# Patient Record
Sex: Female | Born: 1985 | Race: White | Hispanic: No | Marital: Single
Health system: Midwestern US, Community
[De-identification: ages and names within clinical notes are randomized; demographics above are authoritative.]

## PROBLEM LIST (undated history)

## (undated) DIAGNOSIS — M25549 Pain in joints of unspecified hand: Secondary | ICD-10-CM

## (undated) HISTORY — PX: ABDOMINAL HYSTERECTOMY: SHX81

## (undated) HISTORY — PX: APPENDECTOMY: SHX54

---

## 2010-11-12 LAB — CBC WITH DIFFERENTIAL
Absolute Baso #: 0.1 10*3/uL (ref 0.0–0.2)
Absolute Eos #: 0.1 10*3/uL (ref 0.0–0.4)
Absolute Lymph #: 3.7 10*3/uL (ref 1.0–4.8)
Absolute Mono #: 1 10*3/uL (ref 0.1–1.3)
Absolute Neut #: 10.8 10*3/uL — ABNORMAL HIGH (ref 1.3–9.1)
Basophils: 0 % (ref 0–2)
Eosinophils %: 1 % (ref 0–4)
Hematocrit: 36.6 % (ref 36–46)
Hemoglobin: 13 g/dL (ref 12.0–16.0)
Lymphocytes: 24 % (ref 22–44)
MCH: 31.4 pg (ref 26–34)
MCHC: 35.6 g/dL (ref 31–37)
MCV: 88.3 fL (ref 80–100)
MPV: 8.3 fL (ref 6.0–12.0)
Monocytes: 7 % (ref 1–7)
Platelet Count: 293 10*3/uL (ref 150–450)
RBC: 4.15 m/uL (ref 4.0–5.2)
RDW: 12 % (ref 11.5–14.9)
Seg Neutrophils: 68 % — ABNORMAL HIGH (ref 36–66)
WBC: 15.7 10*3/uL — ABNORMAL HIGH (ref 3.5–11.0)

## 2010-11-12 LAB — MICROSCOPIC URINALYSIS
RBC, UA: 0 /HPF
WBC, UA: 2 /HPF

## 2010-11-12 LAB — UA W/REFLEX CULTURE
Bilirubin, Urine: NEGATIVE
Glucose, UA: NEGATIVE
Ketones, Urine: NEGATIVE
Leukocyte Esterase, Urine: NEGATIVE
Nitrite, Urine: NEGATIVE
Protein, UA: NEGATIVE
Specific Gravity, UA: 1.027 (ref 1.000–1.030)
Urine Hgb: NEGATIVE
Urobilinogen, Urine: NORMAL
pH, UA: 6 (ref 5.0–8.0)

## 2010-11-12 LAB — VAGINITIS DNA PROBE
Direct Exam: NEGATIVE
Direct Exam: NEGATIVE
Direct Exam: NEGATIVE

## 2010-11-12 LAB — ELECTROLYTE PANEL
Anion Gap: 8 mmol/L (ref 8–16)
CO2: 26 mmol/L (ref 20–31)
Chloride: 108 mmol/L (ref 98–110)
Potassium: 3.3 mmol/L — ABNORMAL LOW (ref 3.5–5.1)
Sodium: 139 mmol/L (ref 136–145)

## 2010-11-12 LAB — GLUCOSE, RANDOM: Glucose: 105 mg/dL (ref 74–106)

## 2010-11-12 LAB — BUN & CREATININE
BUN: 12 mg/dL (ref 6–20)
Creatinine: 0.65 mg/dL (ref 0.4–1.0)
GFR African American: >60 mL/min (ref >60)
GFR Non-African American: >60 mL/min (ref >60)

## 2010-11-12 LAB — PREGNANCY, URINE: HCG(Urine) Pregnancy Test: NEGATIVE

## 2010-11-13 LAB — C. TRACHOMATIS / N. GONORRHOEAE, DNA

## 2010-11-13 LAB — URINE CULTURE CLEAN CATCH: Culture: NO GROWTH

## 2012-04-14 LAB — PREGNANCY, URINE: HCG(Urine) Pregnancy Test: NEGATIVE

## 2012-07-23 ENCOUNTER — Inpatient Hospital Stay
Admit: 2012-07-23 | Disposition: A | Discharge status: Nursing Home (Includes ICF) | Attending: Psychiatry | Admitting: Psychiatry

## 2013-03-31 ENCOUNTER — Inpatient Hospital Stay
Admit: 2013-03-31 | Discharge: 2013-03-31 | Disposition: A | Discharge status: Home / Self Care | Attending: Emergency Medicine

## 2013-03-31 MED ORDER — MORPHINE SULFATE (PF) 10 MG/ML IV SOLN
10 MG/ML | Freq: Once | INTRAVENOUS | Status: AC
Start: 2013-03-31 — End: 2013-03-31
  Administered 2013-03-31: 10:00:00 via INTRAMUSCULAR

## 2013-03-31 MED ORDER — OXYCODONE-ACETAMINOPHEN 5-325 MG PO TABS
5-325 MG | ORAL_TABLET | ORAL | Status: AC | PRN
Start: 2013-03-31 — End: 2013-04-20

## 2013-03-31 MED ORDER — MORPHINE SULFATE (PF) 4 MG/ML IV SOLN
4 MG/ML | Freq: Once | INTRAVENOUS | Status: AC
Start: 2013-03-31 — End: 2013-03-31
  Administered 2013-03-31: 08:00:00 via INTRAMUSCULAR

## 2013-03-31 MED FILL — MORPHINE SULFATE (PF) 10 MG/ML IV SOLN: 10 mg/mL | INTRAVENOUS | Qty: 1

## 2013-03-31 MED FILL — MORPHINE SULFATE (PF) 4 MG/ML IV SOLN: 4 mg/mL | INTRAVENOUS | Qty: 1

## 2013-03-31 NOTE — ED Notes (Signed)
Pt. States that she slipped and fell at 2300 on her kitchen floor and hurt her left knee, hip, buttocks, and back. Pt. States she feel on her butt/back, but her knee twisted funny in the fall. Pt. States pain 9/10, and that she cannot get comfortable. Pt. States 4 previous knee surgeries on this knee.    Creig Hines, RN  03/31/13 0230

## 2013-03-31 NOTE — Discharge Instructions (Signed)
ICE PACK OFF AND ON FOR SWELLING.  PERCOCET AS NEEDED FOR SEVERE PAIN OTHERWISE ES TYLENOL.    Back Exercises  Back exercises help treat and prevent back injuries. The goal is to increase your strength in your belly (abdominal) and back muscles. These exercises can also help with flexibility. Start these exercises when told by your doctor.  HOME CARE  Back exercises include:  Pelvic Tilt.   Lie on your back with your knees bent. Tilt your pelvis until the lower part of your back is against the floor. Hold this position 5 to 10 sec. Repeat this exercise 5 to 10 times.  Knee to Chest.   Pull 1 knee up against your chest and hold for 20 to 30 seconds. Repeat this with the other knee. This may be done with the other leg straight or bent, whichever feels better. Then, pull both knees up against your chest.  Sit-Ups or Curl-Ups.   Bend your knees 90 degrees. Start with tilting your pelvis, and do a partial, slow sit-up. Only lift your upper half 30 to 45 degrees off the floor. Take at least 2 to 3 seonds for each sit-up. Do not do sit-ups with your knees out straight. If partial sit-ups are difficult, simply do the above but with only tightening your belly (abdominal) muscles and holding it as told.  Hip-Lift.   Lie on your back with your knees flexed 90 degrees. Push down with your feet and shoulders as you raise your hips 2 inches off the floor. Hold for 10 seconds, repeat 5 to 10 times.  Back Arches.   Lie on your stomach. Prop yourself up on bent elbows. Slowly press on your hands, causing an arch in your low back. Repeat 3 to 5 times.  Shoulder-Lifts.   Lie face down with arms beside your body. Keep hips and belly pressed to floor as you slowly lift your head and shoulders off the floor.  Do not overdo your exercises. Be careful in the beginning. Exercises may cause you some mild back discomfort. If the pain lasts for more than 15 minutes, stop the exercises until you see your doctor. Improvement with exercise  for back problems is slow.   Document Released: 11/23/2010 Document Revised: 03/  09/2012 Document Reviewed: 08/22/2011  [facility] Patient Information 2014 [place], [place].      Knee Sprain  A knee sprain happens when the bands of tissue that hold your knee bone together (ligaments) stretch too much or tear. This injury can take several weeks to heal.  HOME CARE   Rest the injured area.   Slowly start using the joint as told by your doctor.   Use crutches as told. Do not put weight on your injured knee.   Reapply your elastic wrap every 3 to 4 hours.   Lie down for 24 hours. Raise (elevate) your injured knee to lessen puffiness (swelling).   Put ice on the injured area.   Put ice in a plastic bag.   Place a towel between your skin and the bag.   Leave the ice on for 15-20 minutes, 3-4 times a day.   Wear a splint, cast, or an elastic wrap as told by your doctor.   Only take medicine as told by your doctor.  GET HELP RIGHT AWAY IF:    Your bruising, puffiness, or pain gets worse.   You have cold, numb, or blue toes.   You have trouble walking.   Your pain is worse when  you move your toes.   Your pain does not get better with medicine.  MAKE SURE YOU:    Understand these instructions.   Will watch your condition.   Will get help right away if you are not doing well or get worse.  Document Released: 10/09/2009 Document Revised: 01/13/2012 Document Reviewed: 10/09/2009  [facility] Patient Information 2014 [place], [place].      [facility] Emergency Medicine Clinic List    [facility]  [address]  [phone] Pediatric Primary Care  Adult Primary Care  OB/GYN/Prenatal/Specialty Clinics Monday - Friday  8a - 430p   [facility]  [phone] Assistance with applying for chronic long term meds (high BP, Diabetes, etc), offered thru programs made available by various pharmaceutical companies Monday - Friday  Call to make an appointment   [facility]  [address]  [phone] Pediatrics and [facility] Practice Monday - Friday  9a - 7p   [facility]  [address]  [phone] Adult Medicine, Pediatrics, OB/GYN Monday - Friday  8:30a - 5p   [facility]  [address]  [phone]  Wednesday AM each week   [facility]  [facility]  [address] Adult Internal Medicine   [facility])  [phone]  OB/GYN Clinic  [phone]    Pediatrics Clinic  [phone]    Podiatry  [phone] Monday, Tuesday, Thursday, Friday  8a - 4p  Wednesday 1p - 4p  Monday, Tuesday, Thursday, Friday  10a - 4p  Wednesday 1p - 4p  Monday, Tuesday, Thursday, Friday  8:30a - 4:15p  Wednesday 12:30p - 4:15p  Monday, Wednesday, Friday  1p - 5p   Health Department  [facility]  [address]  [phone] Pediatric Primary Care  Adult Primary Care  OB/Prenatal Monday - Wednesday, Friday  Monday - Friday 8a - 12p  Thursday 8a - 4:45p   [facility]  [address] [facility]  [phone]  [address]  [phone] Pregnancy  Pre & Post Adoption Counseling  Pregnancy Support  Prenatal / nutrition Care  Reward Incentive Program Mon & Thurs (10a - 2p)  Tues (10a - 430p)  Caleen [place] (9a - 1p)   [facility]  [address] OB/GYN   [phone]  Adult Internal Medicine   [phone]  Pediatrics  [phone]  Neuro / Headache  [phone] Monday - Friday  8:30a - 5p   [facility]  [address]  [phone] Family Practice Monday - Friday  9a - 5p   [facility]  [address]  [phone] Family Practice Monday, Tuesday, Thursday, Friday  9a - 5p  (closed 12p - 1p)  Wednesday 1p - 5p   [facility]   Specialty Clinics  [phone] Burn/Plastric, ENT, GI, Orthopedics, Surgical / Trauma, Urology, Vascular Call for an appointment   [facility]  [address]  [phone] Adult Medicine, Eye Clinic, Dental  Patient must be certified  homeless  Under age 66 not accepted Days and hours vary (Doors open at 8:30a - day of week varies)  Call for an appointment     [facility]  [address] Family Practice  [phone]  OB   [phone] Monday, Tuesday, Wednesday, Friday 9a - 5p  Thursday 9a - 6p  Wednesday (OB only)   [facility]  [address]  [phone]    [address]  [phone]   OB/Prenatal      OB/Prenatal Monday - Thursday pa - 6p  Friday 10a - 3p  Saturday 1st & 3rd 10a - 2p  Wednesday 6p - 8p (HIV Testing)  Monday - Friday  10a - 3p   [facility]  [address]  [phone] Free nurse visits  [place] programs  Counseling  Pregnancy Class Call   [facility]  [address]  [phone] Various Clinics 8a - 5:30p   [facility]  [address]  [place], [place] [address]  [phone] OB/Prenatal    Family Practice Tuesday 8a - 4:45p    Monday - Wednesday, Friday  8a - 4:45p   [facility] Family Medicine  [facility]  [address]  [phone] Family Practice Monday - Friday  8a - 4:30p   [facility]  [address] [place], [place] [address]  [phone] [place], [place] [address]  [phone]    Monday - Friday  8a - 4:30p    Monday - Friday 8a - 4:30p  Thursday 8a - 8p     Outpatient Clinics     Asthma Management Clinic  [facility]  [address]  [phone]  Monday - Friday  9a - 5p   [facility]  [phone]  Call for an appointment   [facility]  Heart Failure Clinic  [address]  [phone]  Monday - Friday  8:30a - 4p   [facility]  [address]  [phone] Must have source of income and must bring (2) recent check stubs to appointment By appointment only   [facility]  [address]  [phone] Patient must be homeless, call for   eligibility guidelines.  Under age 74 NOT accepted Days and  hours vary (Doors open at 8:30a - day of week varies)  Call for an appointment   Miscellaneous Information     [employer] First [employer] for Help  [phone])  Call for an appointment   H.E.L.P   [facility] Eligibility [facility])  [phone]   toll free [phone] For financial assistance

## 2013-04-08 NOTE — ED Provider Notes (Signed)
[facility] ED  eMERGENCY dEPARTMENT eNCOUnter      Pt Name: Linda Gilbert  MRN: [id number]  Birthdate 09-Jan-1986  Date of evaluation: 03/31/2013  Provider: Christean Leaf, MD    CHIEF COMPLAINT       Chief Complaint   Patient presents with   ??? Fall   ??? Hip Pain     left side pain   ??? Knee Injury   ??? Back Pain       CRITICAL CARE TIME   Total Critical Care time was 0 minutes, excluding separately reportable procedures.  There was a high probability of clinically significant/life threatening deterioration in the patient's condition which required my urgent intervention.        HISTORY OF PRESENT ILLNESS  (Location/Symptom, Timing/Onset, Context/Setting, Quality, Duration, Modifying Factors, Severity.)   Linda Gilbert is a 27 y.o. female who presents to the emergency department stating that she slipped and fell in her kitchen floor about 3 hours prior to admission, she injured her left knee left hip and buttocks and lower back.  She landed on her buttocks and her left knee twisted.  Pain is 9/10 and a pain scale and that she cannot get comfortable she had 4 previous knee surgeries in the left knee.       Nursing Notes were reviewed.    REVIEW OF SYSTEMS    (2-9 systems for level 4, 10 or more for level 5)     Review of Systems   Constitutional: Negative.    HENT: Negative.    Eyes: Negative.    Respiratory: Negative.    Cardiovascular: Negative.    Gastrointestinal: Negative.    Genitourinary: Negative.    Musculoskeletal: Positive for back pain.   Skin: Negative.    Hematological: Negative.          Except as noted above the remainder of the review of systems was reviewed and negative.       PAST MEDICAL HISTORY     Past Medical History   Diagnosis Date   ??? Kidney stone    ??? Depression    ??? Bipolar 1 disorder        SURGICAL HISTORY       Past Surgical History   Procedure Laterality Date   ??? Knee surgery Bilateral    ??? Appendectomy     ??? Cholecystectomy     ??? Tumor removal Left    ??? Ovarian cyst removal  Bilateral    ??? Tonsillectomy     ??? Tubal ligation Bilateral        CURRENT MEDICATIONS       Discharge Medication List as of 03/31/2013  6:12 AM      CONTINUE these medications which have NOT CHANGED    Details   omeprazole (PRILOSEC) 20 MG capsule Take 20 mg by mouth daily.      lurasidone (LATUDA) 40 MG TABS tablet Take 80 mg by mouth daily.      morphine (KADIAN) 30 MG SR capsule Take 30 mg by mouth daily.      cloNIDine (CATAPRES) 0.2 MG tablet Take 0.2 mg by mouth 2 times daily.      sertraline (ZOLOFT) 100 MG tablet Take 100 mg by mouth daily.      gabapentin (NEURONTIN) 100 MG capsule Take  by mouth daily.             ALLERGIES     Dilaudid; Tape; and Vicodin    FAMILY  HISTORY       Family History   Problem Relation Age of Onset   ??? Cancer Maternal Aunt    ??? Cancer Maternal Grandfather    ??? Cancer Paternal Grandfather           SOCIAL HISTORY       History     Social History   ??? Marital Status: Single     Spouse Name: N/A     Number of Children: N/A   ??? Years of Education: N/A     Social History Main Topics   ??? Smoking status: Current Every Day Smoker -- 0.75 packs/day for 12 years     Types: Cigarettes   ??? Smokeless tobacco: Never Used   ??? Alcohol Use: No   ??? Drug Use: No   ??? Sexually Active: Yes     Other Topics Concern   ??? None     Social History Narrative   ??? None       PHYSICAL EXAM    (up to 7 for level 4, 8 or more for level 5)   ED Triage Vitals   BP Temp Temp Source Pulse Resp SpO2 Height Weight   03/31/13 0039 03/31/13 0039 03/31/13 0039 03/31/13 0039 03/31/13 0039 03/31/13 0039 03/31/13 0039 03/31/13 0039   131/79 mmHg 97.9 ??F (36.6 ??C) Oral 88 16 98 % 5\' 3"  (1.6 m) 272 lb (123.378 kg)       Physical Exam   Nursing note and vitals reviewed.  Constitutional: She is oriented to person, place, and time. She appears well-nourished.   HENT:   Head: Normocephalic and atraumatic.   Eyes: EOM are normal.   Neck: Normal range of motion. Neck supple.   Cardiovascular: Normal rate, regular rhythm and normal  heart sounds.    Pulmonary/Chest: Effort normal and breath sounds normal.   Abdominal: Soft. Bowel sounds are normal.   Musculoskeletal:   Exam the left knee there is no effusion or any deformity there is tenderness all around the knee and pain on range of motion.  Neurovascular examination is normal.  She has left paralumbar tenderness straight leg raises are negative bilaterally.  She is morbidly obese at 272 pounds.   Neurological: She is alert and oriented to person, place, and time.   Skin: Skin is warm and dry.           DIAGNOSTIC RESULTS     EKG: All EKG's are interpreted by the Emergency Department Physician who either signs or Co-signs this chart in the absence of a cardiologist.    none    RADIOLOGY:   Non-plain film images such as CT, Ultrasound and MRI are read by the radiologist. Plain radiographic images are visualized and preliminarily interpreted by the emergency physician with the below findings:        Interpretation per the Radiologist below, if available at the time of this note:    XR HIP LEFT STANDARD    Final Result:        XR KNEE LEFT STANDARD EXTENDED VW    Final Result:        XR LUMBAR SPINE LIMITED    Final Result:        XR PELVIS STANDARD    Final Result:          ** FINAL **   Procedure: HIP 2 OR MORE VWS LT    CDX 03/31/2013 [id number]   Reason for Exam: Kizzie Furnish      FULL RESULT:  exam: Left hip 2 views      History: Injury      Comparison: None      Findings:Mineralization is within normal limits. There is   anatomic alignment. There are no fractures. Soft tissues are   grossly normal.         IMPRESSION: There is no acute abnormality.      This report was electronically signed by:      Loleta Dicker MD      28th May, 2014 5:18:00AM EDT   Transcribed by: [facility] on Mar 31 2013 5:18A    Read by: Shanda Bumps on Mar 31 2013 5:18A    Electronically Signed by: DR. Zachery Dakins on: Mar 31 2013 5:18A              ** FINAL ** Procedure: KNEE 3 VIEW WITH SUNRISE LEFT CDX 03/31/2013  [id number] Reason for Exam: Kizzie Furnish FULL RESULT: exam: Left knee 4 views History: Injury Comparison: Mar 30, 2012 Findings: There is no gross joint effusion.postsurgical changes are present with orthopedic hardware anterior patella and proximal tibia. There are no lucencies about the hardware. There is no acute osseous abnormality. Medial femoral tibial joint space narrowing is present. Osteophyte formation is present. IMPRESSION: Postsurgical changes. There is no acute osseous abnormality. Stable mild degenerative changes. This report was electronically signed by: Loleta Dicker MD 28th May, 2014 5:17:00AM EDT Transcribed by: [facility] on Mar 31 2013 5:17A Read by: Shanda Bumps on Mar 31 2013 5:17A Electronically Signed by: DR. Zachery Dakins on: Mar 31 2013 5:17A     ** FINAL ** Procedure: LUMBAR 2 VW CDX 03/31/2013 [id number] Reason for Exam: Kizzie Furnish FULL RESULT: exam: Lumbar spine 2 views History: Injury comparison: April 15, 2012 Findings: Surgical clips are present right upper quadrant. There are 5 lumbar type vertebral bodies. Pedicles are intact. paraspinous soft tissues are grossly normal. Vertebral body heights are preserved. There are no fractures. IMPRESSION: Negative lumbar spine. This report was electronically signed by: Loleta Dicker MD 28th May, 2014 5:18:00AM EDT Transcribed by: [facility] on Mar 31 2013 5:18A Read by: Shanda Bumps on Mar 31 2013 5:18A Electronically Signed by: DR. Zachery Dakins on: Mar 31 2013 5:18A   ** FINAL **   Procedure: PELVIS 1 OR 2 VW    CDX 03/31/2013 [id number]   Reason for Exam: Kizzie Furnish      FULL RESULT: exam: AP pelvis one view      History: Injury      Comparison: None      Findings: SI joints are symmetric. Sacral foramina are within   normal limits. The hips demonstrate anatomic alignment. The   pelvic ring is intact.         IMPRESSION: There is no acute osseous abnormality.      This report was electronically signed by:      Loleta Dicker MD      28th May, 2014  5:21:00AM EDT   Transcribed by: [facility] on Mar 31 2013 5:21A    Read by: Shanda Bumps on Mar 31 2013 5:21A    Electronically Signed by: DR. Zachery Dakins on: Mar 31 2013 5:21A                ED BEDSIDE ULTRASOUND:   Performed by ED Physician - none    LABS:  Labs Reviewed - No data to display    All other labs were within normal range or  not returned as of this dictation.    EMERGENCY DEPARTMENT COURSE and DIFFERENTIAL DIAGNOSIS/MDM:   Vitals:    Filed Vitals:    03/31/13 0039 03/31/13 0239 03/31/13 0600   BP: 131/79 130/64 127/63   Pulse: 88 81 69   Temp: 97.9 ??F (36.6 ??C)     TempSrc: Oral     Resp: 16 18 14    Height: 5\' 3"  (1.6 m)     Weight: 272 lb (123.378 kg)     SpO2: 98% 100% 100%       The patient was given morphinefor pain, her x-rays are all within normal limits no evidence of any fractures.  She was given instructions, a prescription and was discharged.    CONSULTS:  None    PROCEDURES:  None    FINAL IMPRESSION      1. Left knee sprain, initial encounter    2. Acute exacerbation of chronic low back pain          DISPOSITION/PLAN   DISPOSITION Decision to Discharge    PATIENT REFERRED TO:  No follow-up provider specified.    DISCHARGE MEDICATIONS:  Discharge Medication List as of 03/31/2013  6:12 AM      START taking these medications    Details   oxyCODONE-acetaminophen (PERCOCET) 5-325 MG per tablet Take 2 tablets by mouth every 4 hours as needed for Pain for 20 days., Disp-20 tablet, R-0             (Please note that portions of this note were completed with a voice recognition program.  Efforts were made to edit the dictations but occasionally words are mis-transcribed.)    Christean Leaf, MD  Attending Emergency Physician        Christean Leaf, MD  04/08/13 [phone]

## 2013-04-24 NOTE — ED Provider Notes (Signed)
[facility] ED  eMERGENCY dEPARTMENT eNCOUnter      Pt Name: Linda Gilbert  MRN: [id number]  Birthdate Aug 08, 1986  Date of evaluation: 04/24/2013  Provider: Lenice Pressman, MD    CHIEF COMPLAINT       Chief Complaint   Patient presents with   ??? Migraine       CRITICAL CARE TIME      None.      HISTORY OF PRESENT ILLNESS  (Location/Symptom, Timing/Onset, Context/Setting, Quality, Duration, Modifying Factors, Severity.)   Linda Gilbert is a 27 y.o. female who presents to the emergency department with a chief complaint of generalized bilateral headache associated with nausea, numerous episodes of vomiting, photophobia and phonophobia.  Headache was right sided initially and has become generalized.  Symptoms started this morning.  She denies any fever or chills and also denies any tingling, numbness or weakness in any of the extremities.  Headache was mild initially and has been gradually progressive.  This is her typical usual migraine headache and her last migraine was about one week ago.  She has had CAT scan of the brain in the past and has been normal.     Location/Symptom: Generalized bilateral headache, nausea, photophobia and phonophobia  Timing/Onset: This morning  Context/Setting: While at home resting  Quality: Throbbing headache similar to previous migraines  Duration: Since this morning gradually progressive.  Modifying Factors: Increases with light sensitivity and sensitive to noise and smells  Severity: Moderate    Nursing Notes were reviewed.    REVIEW OF SYSTEMS    (2-9 systems for level 4, 10 or more for level 5)     Review of Systems   Constitutional: Negative for fever, chills, appetite change and unexpected weight change.   HENT: Negative for ear pain, congestion, sore throat, rhinorrhea, neck pain, dental problem and postnasal drip.    Eyes: Positive for photophobia. Negative for pain, discharge, redness and visual disturbance.   Respiratory: Negative for cough, shortness of breath and  wheezing.    Cardiovascular: Negative for chest pain, palpitations and leg swelling.   Gastrointestinal: Positive for nausea and vomiting. Negative for abdominal pain, diarrhea and constipation.   Endocrine: Negative for polydipsia and polyuria.   Genitourinary: Negative for dysuria, frequency and flank pain.   Musculoskeletal: Negative for myalgias, back pain and arthralgias.   Skin: Negative for rash and wound.   Neurological: Positive for headaches. Negative for dizziness, seizures, syncope, speech difficulty, weakness, light-headedness and numbness.   All other systems reviewed and are negative.         Except as noted above the remainder of the review of systems was reviewed and negative.       PAST MEDICAL HISTORY     Past Medical History   Diagnosis Date   ??? Kidney stone    ??? Depression    ??? Bipolar 1 disorder [facility])        SURGICAL HISTORY       Past Surgical History   Procedure Laterality Date   ??? Knee surgery Bilateral    ??? Appendectomy     ??? Cholecystectomy     ??? Tumor removal Left    ??? Ovarian cyst removal Bilateral    ??? Tonsillectomy     ??? Tubal ligation Bilateral        CURRENT MEDICATIONS       Previous Medications    CLONIDINE (CATAPRES) 0.2 MG TABLET    Take 0.2 mg by mouth 2  times daily.    GABAPENTIN (NEURONTIN) 100 MG CAPSULE    Take  by mouth daily.    LURASIDONE (LATUDA) 40 MG TABS TABLET    Take 80 mg by mouth daily.    MORPHINE (KADIAN) 30 MG SR CAPSULE    Take 30 mg by mouth daily.    OMEPRAZOLE (PRILOSEC) 20 MG CAPSULE    Take 20 mg by mouth daily.    SERTRALINE (ZOLOFT) 100 MG TABLET    Take 100 mg by mouth daily.       ALLERGIES     Dilaudid; Tape; and Vicodin    FAMILY HISTORY       Family History   Problem Relation Age of Onset   ??? Cancer Maternal Aunt    ??? Cancer Maternal Grandfather    ??? Cancer Paternal Grandfather         SOCIAL HISTORY       History     Social History   ??? Marital Status: Single     Spouse Name: N/A     Number of Children: N/A   ??? Years of Education: N/A     Social  History Main Topics   ??? Smoking status: Current Every Day Smoker -- 0.75 packs/day for 12 years     Types: Cigarettes   ??? Smokeless tobacco: Never Used   ??? Alcohol Use: No   ??? Drug Use: No   ??? Sexually Active: Yes     Other Topics Concern   ??? None     Social History Narrative   ??? None       PHYSICAL EXAM    (up to 7 for level 4, 8 or more for level 5)   ED Triage Vitals   BP Temp Temp Source Pulse Resp SpO2 Height Weight   04/24/13 2237 04/24/13 2237 04/24/13 2237 04/24/13 2237 04/24/13 2237 04/24/13 2237 -- 04/24/13 2237   154/79 mmHg 97.3 ??F (36.3 ??C) Oral 77 16 100 %  280 lb (127.007 kg)       Physical Exam   Nursing note and vitals reviewed.  Constitutional: She is oriented to person, place, and time. She appears well-developed and well-nourished. No distress.   HENT:   Head: Normocephalic and atraumatic.   Nose: Nose normal.   Mouth/Throat: Oropharynx is clear and moist. No oropharyngeal exudate.   Eyes: EOM are normal. Pupils are equal, round, and reactive to light. No scleral icterus.   Neck: Neck supple. No JVD present. No tracheal deviation present. No thyromegaly present.   Cardiovascular: Normal rate, regular rhythm, normal heart sounds and intact distal pulses.  Exam reveals no gallop and no friction rub.    No murmur heard.  Pulmonary/Chest: Effort normal and breath sounds normal. No respiratory distress. She has no wheezes. She has no rales.   Abdominal: Soft. Bowel sounds are normal. She exhibits no distension and no mass. There is no tenderness. There is no rebound and no guarding.   Musculoskeletal: She exhibits no edema and no tenderness.   Lymphadenopathy:     She has no cervical adenopathy.   Neurological: She is alert and oriented to person, place, and time. She has normal strength and normal reflexes. No cranial nerve deficit or sensory deficit. She displays a negative Romberg sign. GCS eye subscore is 4. GCS verbal subscore is 5. GCS motor subscore is 6.   Skin: Skin is warm and dry. No rash  noted. No erythema.   Psychiatric: She has a normal mood  and affect. Her behavior is normal. Judgment and thought content normal.           DIAGNOSTIC RESULTS     EKG: All EKG's are interpreted by the Emergency Department Physician who either signs or Co-signs this chart in the absence of a cardiologist.    None     RADIOLOGY:   Non-plain film images such as CT, Ultrasound and MRI are read by the radiologist. Plain radiographic images are visualized and preliminarily interpreted by the emergency physician with the below findings:    None    Interpretation per the Radiologist below, if available at the time of this note:           ED BEDSIDE ULTRASOUND:   Performed by ED Physician - none    LABS:  Labs Reviewed - No data to display    All other labs were within normal range or not returned as of this dictation.    EMERGENCY DEPARTMENT COURSE and DIFFERENTIAL DIAGNOSIS/MDM:   Vitals:    Filed Vitals:    04/24/13 2237   BP: 154/79   Pulse: 77   Temp: 97.3 ??F (36.3 ??C)   TempSrc: Oral   Resp: 16   Weight: 280 lb (127.007 kg)   SpO2: 100%         Orders Placed This Encounter   Medications   ??? 0.9 % sodium chloride bolus     Sig:    ??? ketorolac (TORADOL) injection 30 mg     Sig:    ??? metoclopramide (REGLAN) injection 10 mg     Sig:    ??? diphenhydrAMINE (BENADRYL) injection 25 mg     Sig:        During the emergency department course, patient was given IV 0.9 normal saline 1 L wide open, Toradol 30 mg, Reglan 10 mg and Benadryl 25 mg IV push.  She is feeling much better and resting comfortably.  Her headache and nausea are completely resolved.  She prefers to go home.    The patient presents with a benign-appearing headache.  The neurologic examination is normal.  My suspicion for serious pathology is low given a lack of significant risk factors and reassuring history and physical examination.  I see nothing to suggest subarachnoid hemorrhage, meningitis, encephalitis, mass lesion, bleeding or thrombosis.  I feel the  patient can be safely discharged to home with outpatient follow up.  Instructions have been given for the patient to return if there is any significant worsening of the headache or the development of confusion, vision change, weakness, numbness, difficulty with speech or walking.        CONSULTS:  None    PROCEDURES:  None    FINAL IMPRESSION      1. Migraine syndrome    2. Nausea and vomiting          DISPOSITION/PLAN   DISPOSITION Decision to Discharge    PATIENT REFERRED TO:  Clinic list    Call in 3 days  Return if symptoms worsen      DISCHARGE MEDICATIONS:  New Prescriptions    No medications on file       (Please note that portions of this note were completed with a voice recognition program.  Efforts were made to edit the dictations but occasionally words are mis-transcribed.)    Lenice Pressman, MD  Attending Emergency Physician        Lenice Pressman, MD  04/25/13 [phone]

## 2013-04-24 NOTE — ED Notes (Signed)
Pt states that she is feeling a lot better. Pt rates pain 6/10 but does not have the nausea and pain like she did on arrival.     Townsend RogerJennifer L Raymone Pembroke, RN  04/25/13 0000

## 2013-04-25 ENCOUNTER — Inpatient Hospital Stay: Admit: 2013-04-25 | Discharge: 2013-04-25 | Disposition: A | Discharge status: Home / Self Care | Attending: Specialist

## 2013-04-25 MED ORDER — METOCLOPRAMIDE HCL 5 MG/ML IJ SOLN
5 MG/ML | Freq: Once | INTRAMUSCULAR | Status: AC
Start: 2013-04-25 — End: 2013-04-24
  Administered 2013-04-25: 03:00:00 via INTRAVENOUS

## 2013-04-25 MED ORDER — KETOROLAC TROMETHAMINE 30 MG/ML IJ SOLN
30 MG/ML | Freq: Once | INTRAMUSCULAR | Status: AC
Start: 2013-04-25 — End: 2013-04-24
  Administered 2013-04-25: 03:00:00 via INTRAVENOUS

## 2013-04-25 MED ORDER — SODIUM CHLORIDE 0.9 % IV BOLUS
0.9 % | Freq: Once | INTRAVENOUS | Status: AC
Start: 2013-04-25 — End: 2013-04-24
  Administered 2013-04-25: 03:00:00 via INTRAVENOUS

## 2013-04-25 MED ORDER — DIPHENHYDRAMINE HCL 50 MG/ML IJ SOLN
50 MG/ML | Freq: Once | INTRAMUSCULAR | Status: AC
Start: 2013-04-25 — End: 2013-04-24
  Administered 2013-04-25: 03:00:00 via INTRAVENOUS

## 2013-04-25 MED FILL — DIPHENHYDRAMINE HCL 50 MG/ML IJ SOLN: 50 MG/ML | INTRAMUSCULAR | Qty: 1

## 2013-04-25 MED FILL — KETOROLAC TROMETHAMINE 30 MG/ML IJ SOLN: 30 MG/ML | INTRAMUSCULAR | Qty: 1

## 2013-04-25 MED FILL — METOCLOPRAMIDE HCL 5 MG/ML IJ SOLN: 5 MG/ML | INTRAMUSCULAR | Qty: 2

## 2013-04-25 NOTE — Discharge Instructions (Signed)
Migraine Headache  A migraine headache is an intense, throbbing pain on one or both sides of your head. A migraine can last for 30 minutes to several hours.  CAUSES   The exact cause of a migraine headache is not always known. However, a migraine may be caused when nerves in the brain become irritated and release chemicals that cause inflammation. This causes pain.  SYMPTOMS   Pain on one or both sides of your head.   Pulsating or throbbing pain.   Severe pain that prevents daily activities.   Pain that is aggravated by any physical activity.   Nausea, vomiting, or both.   Dizziness.   Pain with exposure to bright lights, loud noises, or activity.   General sensitivity to bright lights, loud noises, or smells.  Before you get a migraine, you may get warning signs that a migraine is coming (aura). An aura may include:   Seeing flashing lights.   Seeing bright spots, halos, or zig-zag lines.   Having tunnel vision or blurred vision.   Having feelings of numbness or tingling.   Having trouble talking.   Having muscle weakness.  MIGRAINE TRIGGERS   Alcohol.   Smoking.   Stress.   Menstruation.   Aged cheeses.   Foods or drinks that contain nitrates, glutamate, aspartame, or tyramine.   Lack of sleep.   Chocolate.   Caffeine.   Hunger.   Physical exertion.   Fatigue.   Medicines used to treat chest pain (nitroglycerine), birth control pills, estrogen, and some blood pressure medicines.  DIAGNOSIS   A migraine headache is often diagnosed based on:   Symptoms.   Physical examination.   A CT scan or MRI of your head.  TREATMENT  Medicines may be given for pain and nausea. Medicines can also be given to help prevent recurrent migraines.   HOME CARE INSTRUCTIONS   Only take over-the-counter or prescription medicines for pain or discomfort as directed by your caregiver. The use of long-term narcotics is not recommended.   Lie down in a dark, quiet room when you have a migraine.   Keep a journal  to find out what may trigger your migraine headaches. For example, write down:   What you eat and drink.   How much sleep you get.   Any change to your diet or medicines.   Limit alcohol consumption.   Quit smoking if you smoke.   Get 7 to 9 hours of sleep, or as recommended by your caregiver.   Limit stress.   Keep lights dim if bright lights bother you and make your migraines worse.  SEEK IMMEDIATE MEDICAL CARE IF:    Your migraine becomes severe.   You have a fever.   You have a stiff neck.   You have vision loss.   You have muscular weakness or loss of muscle control.   You start losing your balance or have trouble walking.   You feel faint or pass out.   You have severe symptoms that are different from your first symptoms.  MAKE SURE YOU:    Understand these instructions.   Will watch your condition.   Will get help right away if you are not doing well or get worse.  Document Released: 10/21/2005 Document Revised: 01/13/2012 Document Reviewed: 10/11/2011  [facility] Patient Information 2014 [place], [place].    Nausea and Vomiting  Nausea means you feel sick to your stomach. Throwing up (vomiting) is a reflex where stomach contents come out of your  mouth.  HOME CARE    Take medicine as told by your doctor.   Do not force yourself to eat. However, you do need to drink fluids.   If you feel like eating, eat a normal diet as told by your doctor.   Eat rice, wheat, potatoes, bread, lean meats, yogurt, fruits, and vegetables.   Avoid high-fat foods.   Drink enough fluids to keep your pee (urine) clear or pale yellow.   Ask your doctor how to replace body fluid losses (rehydrate). Signs of body fluid loss (dehydration) include:   Feeling very thirsty.   Dry lips and mouth.   Feeling dizzy.   Dark pee.   Peeing less than normal.   Feeling confused.   Fast breathing or heart rate.  GET HELP RIGHT AWAY IF:    You have blood in your throw up.   You have black or bloody poop (stool).   You  have a bad headache or stiff neck.   You feel confused.   You have bad belly (abdominal) pain.   You have chest pain or trouble breathing.   You do not pee at least once every 8 hours.   You have cold, clammy skin.   You keep throwing up after 24 to 48 hours.   You have a fever.  MAKE SURE YOU:    Understand these instructions.   Will watch your condition.   Will get help right away if you are not doing well or get worse.  Document Released: 04/08/2008 Document Revised: 01/13/2012 Document Reviewed: 03/22/2011  [facility] Patient Information 2014 [place], [place].    [facility] Emergency Medicine  Clinic List    [facility]  [address]  [phone]   Pediatric Primary Care/ Adult Primary Care / OB/Prenatal/GYN/Specialty Clinics   Mon - Fri  8a - 4:30p  -----------------------------------------------------------  [facility]  [address]  [phone]   Assistance with applying for chronic long term meds (high BP, Diabetes, etc), offered thru programs made available by various pharmaceutical companies   Mon - Fri           Call to make appointment  -----------------------------------------------------------  [facility]  [address]  [phone]   Pediatrics and [facility] - Fri      9a - 7p  ------------------------------------------------------------  [facility]  [address]  [phone]    Adult Medicine, Pediatrics, OB/GYN      Mon - Fri   8:30a - 5p  -----------------------------------------------------------  [facility]  [address], [place]  [phone]    Wed AM each week    -------------------------------------------------------------                                        [facility]  [address], [place]  [address]     Adult Internal Medicine      [place], [place]    8a - 4p       [phone]         Wed - 1p - 4P              OB/GYN Clinic  [phone] Mon, Tues, Thurs, Fri     10a - 4p  Wed - 1p - 4p  (closed from 12p noon - 1p    Pediatrics Clinic  (  [phone] [place], Fri    8:30 a - 4:15p  Wed - 12:30p - 4:15p    Podiatry Clinic  [phone]  Mon, Wed, Fri     1:00p to 5:00p    Health Department  [facility]  [address]   [phone]   Pediatric Primary Care Mon - Wed & Fri  Adult Primary Care  Mon - Fri 8a - 12p noon  OB/Prenatal   Thurs - 8a - 4:45p      [facility]  [address] - Fri 8:30a - 5p   OB/GYN [phone]   Adult Internal Med [phone]  Pediatrics [phone]  Neuro/Headache [phone]    [facility]  [phone]   [facility] - Fri     9a - 5p    [facility]  [address]  [phone] Adult Medicine, Eye Clinic, Dental  Patient must be certified homeless   Days and hours vary  Call for appointment     [facility]  [address]   Family Practice  [phone]     OB [phone]   [place], Wed, Fri   9a - 5p  Thurs - 9a - 6p     Wed (OB only)    [facility]  [address]  [phone]   [facility], Tues, [place], Fri  9a - 5p    (closed 12p -1p)  Wed - 1p - 5  -----------------------------------------------------------  [facility]  Specialty Clinics   [phone]   Burn/Plastic, ENT, GI, Orthopedics, (Orthopedics D.O.), Surgical, TRAUMA, Urology, Vascular Call for appointment  ----------------------------------------------------    [facility]   [phone] Various Clinics 8a - 5:30    [facility]  [address]  [place], [place]  [address]  [phone]    OB/Prenatal     Tues - [phone] - 4:45p  [facility] - Wed & Fri   8a - 4:45p    [facility]  [address]  [phone]  Family Practice Mon - Fri    8a - 4:30p    [facility]  Psychiatric  [address] [id number]  [phone] Mon - Fri 8a - 4:30p    [phone] [address] [place],  [place]  [address]  [phone]  Mon - Fri  8a-4:30p    Thurs - 8a - 8p                  OutPatient Clinics    Asthma Management Clinic  [facility]  [address]    [phone]  Mon [place]    9a - 5P    Diabetic Education Services  [phone]  Call for appointment    Heart Failure Clinic - [facility]  [address]  [phone]  Mon - Fri  8:30a - 4p        Micsellaneous Information    [facility] for Help    [facility] Eligibility [facility])  [phone]    [phone] or toll-free [phone]         Dental Services  [facility] for the [facility]  [address]  [phone]    [address] [phone]     *  Pt must have source of income & must     * Pt must be homeless; call for eligibility guidelines        bring 2 recent check stubs to appt                               * Under age 32 not accepted     * By appointment only       * Doors open at 8:30a - day of week varies    [place]  [phone]     * By appt only - Mon - Fri - [place]   * One Evening/week  * $25.00 for initial consultation, exam, cleaning  * Does not accept Medicaid

## 2013-05-12 ENCOUNTER — Inpatient Hospital Stay
Admit: 2013-05-12 | Discharge: 2013-05-12 | Disposition: A | Discharge status: Home / Self Care | Attending: Emergency Medicine

## 2013-05-12 MED ORDER — IBUPROFEN 800 MG PO TABS
800 MG | ORAL_TABLET | Freq: Three times a day (TID) | ORAL | Status: AC | PRN
Start: 2013-05-12 — End: 2017-04-17

## 2013-05-12 MED ORDER — ANTIPYRINE-BENZOCAINE 54-14 MG/ML OT SOLN
54-14 MG/ML | OTIC | Status: DC | PRN
Start: 2013-05-12 — End: 2013-05-12
  Administered 2013-05-12: 10:00:00 via OTIC

## 2013-05-12 MED ORDER — AMOXICILLIN 250 MG PO CAPS
250 MG | Freq: Once | ORAL | Status: AC
Start: 2013-05-12 — End: 2013-05-12
  Administered 2013-05-12: 10:00:00 via ORAL

## 2013-05-12 MED ORDER — AMOXICILLIN 500 MG PO CAPS
500 MG | ORAL_CAPSULE | Freq: Three times a day (TID) | ORAL | Status: AC
Start: 2013-05-12 — End: 2013-05-22

## 2013-05-12 MED FILL — AMOXICILLIN 250 MG PO CAPS: 250 MG | ORAL | Qty: 2

## 2013-05-12 MED FILL — ANTIPYRINE-BENZOCAINE 5.4-1.4 % OT SOLN: OTIC | Qty: 10

## 2013-05-12 NOTE — Discharge Instructions (Signed)
Ear Infection, Easing the Pain  Two out of 3 children will have an ear infection before their first birthday. Eight out of 10 will have had one or more ear infections before the age of 3.  Behind the eardrum is a chamber (an empty space) called the middle ear. Inside this chamber is a series of three small bones. These bones send sound from the outer ear, through the eardrum, to the inner ear where the sound waves are converted into nerve impulses. These nerve impulses are sent to the brain to be "heard". It is when the middle ear becomes filled with fluid, and this fluid becomes infected with bacteria (germs) or viruses, that a child develops a middle ear infection. This problem is also called acute (sudden) otitis media.  WHY DO MIDDLE EAR INFECTIONS OCCUR  Middle ear infections are usually a complication of the common cold. The middle ear is connected to the back of the throat by a tunnel called the eustachian tube. With the congestion that accompanies a cold, the eustachian tubes become congested and blocked. This causes the air in the middle ears to become replaced with fluid. Because bacteria are often blocked in the middle ear as well, the fluid becomes infected and a middle ear infection results.  CHILDREN ARE MORE SUSCEPTIBLE  The eustachian tubes are narrower and shorter in younger children, so young children are more prone to ear infections. Younger children also get more colds. Therefore, ear infections occur most most often between the ages of 3 months and 4 years old. By age 3, half of all children have had three or more ear infections.  The condition can also occur as a complication of allergies if the nose and ears become congested. Ear infections also occur without colds or allergies. Some children are prone to ear infections, and come from families with a history of ear infections. In many cases, these children have middle ears that chronically retain fluid because the eustachian tubes will not  stay open.  COMMON SIGNS OF EAR INFECTION  Pain is the most common sign of an ear infection, and fever is the second most common sign. The pain may be anything from mild to severe; depending on the child and perhaps the particular kind of bacteria that is causing the infection.  The fever can run between 101 F (38.3 C) and 103 F (39.4 C), but it may be higher. Occasionally, children will have ear infection with no fever at all.  The pain is typically worse when a child is lying down, so ear infections often first become known in the middle of the night. Infants and toddlers will not be able to say that their ears hurt, but they will:   Tug on their ears.    Put their hands over their ears.    Act fussy or agitated.   It can get confusing for parents because there are several other things that can cause ear pain, and there are lots of other things that can cause fever. For example, teething can cause ear pain, though it probably causes little or no fever. Lots of viruses cause fever, and in little children, viruses can commonly cause high fevers. With high fever, most young children are fretful, and it may be difficult for parents to tell whether anything, much less an ear, hurts.  A good guideline for parents of children older than 3 months is to call the caregiver if a child has a fever and you suspect an earache.   If it is nighttime and your child is not very ill appearing, the call (and the visit to the caregiver's office) can wait until the next morning. For infants younger than 3 months, it is best to call your caregiver.  SHOULD ANTIBIOTICS BE USED  Without antibiotics, 80 percent of all ear infections get better within the week. Treating with an antibiotic only adds another ten percent to the cure rate - bringing it up to ninety percent. Of children with ear infections, 60 percent will be pain-free in 24 hours - whether or not they receive an antibiotic.  Not giving antibiotics for mildly ill children  with ear infections is becoming more common in this [place] among physicians and parents. Results are similar whether or not an antibiotic is prescribed. By not giving an antibiotic you can save:   Expense   Inconvenience   Possibilities of allergic reactions to the medicine   Other side effects.    But, most importantly, giving less antibiotics decreases the likelihood of children developing infections with bacteria (germs) that have become resistant to the common antibiotics and are more difficult to treat.  For children with earaches who are not very ill, parents can discuss with their children's caregivers whether an antibiotic is advisable or not. If physicians and parents choose to hold off on starting an antibiotic, they should have a plan to get back in touch with one another in 48 hours if the symptoms (fever and/or fussiness) have not resolved.  EASING THE PAIN OF AN EAR INFECTION  Whether or not a child is prescribed an antibiotic, parents will want to relieve their child's ear pain. Only take over-the-counter or prescription medicines for pain, discomfort, or fever as directed by your caregiver. Other ideas to help relieve pain include:   Applying a warm washcloth to the ear.    Keeping the child sitting up as much as possible.    Older children who are old enough to chew gum without swallowing it find that chewing gum helps ease their ear pain.   If the ear pain and fever are not getting better over the first 48 hours - whether or not an antibiotic has been prescribed - then you should get back in touch with your child's caregiver. For the child who has not been prescribed an antibiotic, the usual decision may be to prescribe one. For the child who is taking an antibiotic and is not getting better, it may be time to switch to a different antibiotic. The bacteria that is causing the ear infection may be resistant to the current medicine.  ARE "TUBES" NEEDED TO DRAIN AND PREVENT LONG-STANDING  FLUID?  Ear infections often develop after fluid has collected and is trapped in the middle ear. Trapped fluid in the middle ear is not only the cause of ear infections, but it is also the result.  After an ear infection is treated - or gets better by itself - it takes a while for the eustachian tube to open up and get back to normal. Until then, the middle ear contains fluid, not air. In most children, this fluid is gone in 6 weeks, but in about 10 percent of children it will last three months or more. This is called otitis media with effusion.  Regarding concerns about fluid in the middle ear:   First, some children are constantly prone to repeated ear infections - for the same reason that they got their first ear infections.    Second, with fluid   in the middle ear, hearing is reduced, at least mildly. Over time, this can interfere with learning or with speech development. (The concern about interference with learning or speech development is much less if the fluid has only been there for several months.)   Placement of ear tubes is an option when:   The child has had fluid in their middle ears for 4 to 6 months.    Has repeated ear infections.    Other measures fail to clear the fluid or prevent ear infections.    Tests show that the child is having significant hearing problems.   The "tubes" are small plastic tubes that are inserted through the eardrums by an ear, nose and throat (ENT) surgeon. Usually a general anesthetic is used. Tube placement is usually done in the hospital. These tubes usually stay in place for about 6 to 18 months. These tubes keep the middle ear filled with air rather than fluid. The tubes usually fall out by themselves. Some children need to have tubes inserted several times over the course of infancy and the preschool years.  Insertion of ear tubes is one of the most common operations done by ENT surgeons. The placement of tubes is usually safe and most often they are effective in  restoring hearing and reducing the numbers of ear infections. But, like any operation (or medical treatment), parents ought to discuss with the caregiver:   Advantages and disadvantages    Possible risks and alternative treatments   PREVENTING EAR INFECTIONS  There are several things within parents' control that they can do to reduce the numbers of ear infections that their children get.   Breastfeed - Children who are bottle-fed get more ear infections than those who are breast-fed. There are several infection-fighting components in breast milk, including antibody proteins and active white blood cells. Also, breast-fed infants are more likely to be fed in a semi-sitting position, so milk does not tend to collect around the openings of the eustachian tubes at the back of the nose and throat. If you bottle feed, do not prop the bottle. Feed your baby semi-sitting, rather than with him lying down. That will make sure that all the formula gets swallowed, and none collects back by the eustachian tubes. Never prop the bottle on a pillow or let your child feed himself or herself while laying down. This is a matter of safety as well as ear infection prevention.    Keep your home smoke free - Second-hand smoke in the home can increase ear infections in young children by 50 percent. It is an irritant to the upper respiratory passages, paving the way for infection into the middle ears.    Keep up with immunizations - Two immunizations, in particular, can reduce ear infections. The first is the infant pneumococcal vaccine, Prevnar. Infants usually get three doses of this vaccine in the first year of life and one booster dose after the first birthday. Although the main purpose of this vaccine is to prevent blood infections and meningitis, it also will reduce ear infections by about ten percent. Also, infants who get this vaccine are 20 percent less likely to need ventilation tubes in their ears. The other vaccine is the  flu vaccine. Although flu is not considered a common cause of ear infections, flu often causes the congestion that leads to an ear infection. In young children, about 20 percent of episodes of the flu are complicated by ear infection.    Since colds are   the most common precursor of ear infections, avoiding frequent colds in young ear infection-prone children can be helpful. The first line of defense is good hand washing to prevent the spread of viruses, particularly when a family member is ill. Infants and toddlers in childcare settings get more colds and ear infections than those at home. The more children in the childcare setting, the more infections. Choosing a childcare setting with fewer children can be helpful.   Document Released: 04/12/2002 Document Re-Released: 01/15/2010  ExitCare Patient Information 2012 [employer]  Otitis Media, Adult  A middle ear infection is an infection in the space behind the eardrum. It often happens along with a cold. It is caused by a germ that starts growing in that space. Your neck may feel puffy (swollen) on the side of the ear infection.  HOME CARE   Take your medicine as told. Finish it even if you start to feel better.   Nose medicine (nasal decongestant) may help the tube that connects the ear and throat (eustachian tube) drain better. It may also help with discomfort.   Follow up with your doctor in 10 to 14 days or as told by your doctor. This is to make sure the infection is gone.  GET HELP RIGHT AWAY IF:    You do not start to feel better in 2 to 3 days.   You have pain that is not helped with medicine.   You cannot use the medicine as told.   You feel worse instead of better.   You develop puffiness, redness, or pain around the ear.   You get a stiff neck.  MAKE SURE YOU:    Understand these instructions.   Will watch your condition.   Will get help right away if you are not doing well or get worse.  Document Released: 04/08/2008 Document Revised:  01/13/2012 Document Reviewed: 04/08/2008  [employer] Patient Information 2014 [employer]

## 2013-05-12 NOTE — ED Provider Notes (Signed)
[facility] ED  eMERGENCY dEPARTMENT eNCOUnter      Pt Name: Linda Gilbert  MRN: [id number]  Birthdate 02-01-1986  Date of evaluation: 05/12/2013      CHIEF COMPLAINT:   Chief Complaint   Patient presents with   ??? Otalgia     left     HISTORY OF PRESENT ILLNESS    Linda Gilbert is a 27 y.o. female who presents with Left ear pain that started 1 day ago and has gradually gotten worse.    Sharp in nature and again gradually worsening.  Denies any drainage or hearing difficulty.    Denies any recent URI or sore throat or nasal congestion.    Denies chest pain/shortness of breath/nausea/running/fever/chills/abdominal pain.  No diarrhea either.    Symptoms are moderate in severity with nothing making it better or worse.           REVIEW OF SYSTEMS         Review of Systems   Constitutional: Negative for fever and chills.   HENT: Positive for ear pain. Negative for congestion, sore throat, rhinorrhea, sneezing, dental problem and postnasal drip.    Respiratory: Negative for shortness of breath.    Cardiovascular: Negative for chest pain.   Gastrointestinal: Negative for nausea, vomiting, abdominal pain, diarrhea and blood in stool.   Skin: Negative for rash.   Neurological: Negative for weakness (No focal weakness. ).          PAST MEDICAL HISTORY   PMH:  has a past medical history of Kidney stone; Depression; and Bipolar 1 disorder (HCC).  Surgical History:  has past surgical history that includes knee surgery (Bilateral); Appendectomy; Cholecystectomy; tumor removal (Left); ovarian cyst removal (Bilateral); Tonsillectomy; and Tubal ligation (Bilateral).  Social History:  reports that she has been smoking Cigarettes.  She has a 9 pack-year smoking history. She has never used smokeless tobacco. She reports that she does not drink alcohol or use illicit drugs.  Family History: Noncontributory at this time  Psychiatric History: Noncontributory at this time    Allergies:is allergic to dilaudid; tape; and  vicodin.      PHYSICAL EXAM     INITIAL VITALS: BP 158/104   Pulse 86   Temp(Src) 97.5 ??F (36.4 ??C) (Oral)   Resp 16   Ht 5\' 3"  (1.6 m)   Wt 264 lb 8.8 oz (120 kg)   BMI 46.88 kg/m2   SpO2 100%   LMP 04/03/2013       Physical Exam   Vitals reviewed.  Constitutional: She is oriented to person, place, and time.   HENT:   Head: Normocephalic and atraumatic.   Right Ear: External ear normal.   Patient's oropharynx is normal with normal midline uvula and no tonsillar hypertrophy or exudate.    The right TM is normal with a left does have injection with some bulging and clearly mild to moderate otitis media.    No tenderness of the mastoid at all   Eyes: EOM are normal. Pupils are equal, round, and reactive to light.   Neck: Normal range of motion. Neck supple. No JVD present.   Cardiovascular: Normal rate, regular rhythm and normal heart sounds.    Pulmonary/Chest: Effort normal and breath sounds normal. No respiratory distress. She has no wheezes.   Abdominal: Soft. Bowel sounds are normal. She exhibits no distension. There is no tenderness. There is no rebound and no guarding.   Musculoskeletal: She exhibits no edema.   Neurological: She  is alert and oriented to person, place, and time.   Skin: Skin is warm and dry. No rash noted.           EMERGENCY DEPARTMENT COURSE:     We'll give amoxicillin first dose here in the emergency department as well as Auralgan drops      FINAL IMPRESSION:     1. Unspecified otitis media          DISPOSITION:  DISPOSITION Decision to Discharge      PATIENT REFERRED TO:        follow-up with your doctor next 2-3 days or return to the ER if worse or any concerns whatsoever      DISCHARGE MEDICATIONS:  New Prescriptions    AMOXICILLIN (AMOXIL) 500 MG CAPSULE    Take 1 capsule by mouth 3 times daily for 10 days.       (Please note that portions of this note were completed with a voice recognition program.  Efforts were made to edit the dictations but occasionally words are  mis-transcribed.)    Salomon Mast, MD [facility]  Attending Emergency Medicine Physician        Salomon Mast, MD  05/12/13 [phone]

## 2013-05-12 NOTE — ED Notes (Signed)
INITIAL ASSESSMENT:  Arrives ambulatory. Awake/Alert, Oriented x3. GCS=15  Lungs CTAB Post, Heart tones strong, regular.   Palpable distal pulses present, immediate distal refill.   Abd: Soft, non-tender, non-distended.   Handgrasp, push/pull equal.    INITIAL PRESENTATION:    On oral morphine.     Reports 9/10 earache.     Nathanial Millman, RN  05/12/13 [phone]

## 2013-08-24 NOTE — ED Provider Notes (Signed)
[facility] ED  eMERGENCY dEPARTMENT eNCOUnter  Resident Physician    Pt Name: Linda Gilbert  MRN: [id number]  Birthdate [birth year]  PCP: No primary provider on file.  Date of evaluation: 08/24/2013    CHIEF COMPLAINT       Chief Complaint   Patient presents with   ??? Wrist Pain         HISTORY OF PRESENT ILLNESS    Linda Gilbert is a 27 y.o. female who presents With wrist pain.  Patient states that her right wrist does hurt for the past few months.  She's had full work up this including EMG with a neurologist.  Patient states she was diagnosed with carpal tunnel syndrome.  Patient feels the pain is different this time.  Pain is increased.  It is not relieved by home morphine or Percocet.  Denies trauma to the wrist.  She denies recent fevers chills.  She has history of chronic joint pain.         PAST MEDICAL HISTORY     Past Medical History:     has a past medical history of Kidney stone; Depression; and Bipolar 1 disorder (HCC).    Past Surgical History:     has past surgical history that includes knee surgery (Bilateral); Appendectomy; Cholecystectomy; tumor removal (Left); ovarian cyst removal (Bilateral); Tonsillectomy; and Tubal ligation (Bilateral).    Social History:    reports that she has been smoking Cigarettes.  She has a 9 pack-year smoking history. She has never used smokeless tobacco. She reports that she does not drink alcohol or use illicit drugs.    Family History:   has no family status information on file.    family history includes Cancer in her maternal aunt, maternal grandfather, and paternal grandfather.    Allergies:    is allergic to dilaudid; tape; and vicodin.    Home Medications:  Discharge Medication List as of 08/24/2013 11:40 PM      CONTINUE these medications which have NOT CHANGED    Details   oxyCODONE-acetaminophen (PERCOCET) 10-325 MG per tablet Take 1 tablet by mouth every 6 hours as needed for Pain.      morphine (KADIAN) 30 MG SR capsule Take 30 mg by mouth daily.       ibuprofen (ADVIL;MOTRIN) 800 MG tablet Take 1 tablet by mouth every 8 hours as needed for Pain for 20 doses., Disp-20 tablet, R-0      omeprazole (PRILOSEC) 20 MG capsule Take 20 mg by mouth daily.      lurasidone (LATUDA) 40 MG TABS tablet Take 80 mg by mouth daily.      cloNIDine (CATAPRES) 0.2 MG tablet Take 0.2 mg by mouth 2 times daily.      sertraline (ZOLOFT) 100 MG tablet Take 100 mg by mouth daily.      gabapentin (NEURONTIN) 100 MG capsule Take  by mouth daily.               REVIEW OF SYSTEMS       General ROS - No fevers, No chills  Ophthalmic ROS - No discharge, No changes in vision  ENT ROS -  No sore throat, No rhinorrhea  Respiratory ROS - No cough, No shortness of breath  Cardiovascular ROS - No chest pain, No dsypnea on exertion  Gastrointestinal ROS - No abdominal pain, No nausea, No vomiting  Genito-Urinary ROS - No dysuria, No hematuria  Musculoskeletal ROS - No myalgias, No arthalgias  Neurological ROS -  No focal weakness, No loss of sensation  Dermatological ROS - No lesions, No rash    PHYSICAL EXAM        Filed Vitals:    08/24/13 2249   BP: 140/94   Pulse: 95   Temp: 97 ??F (36.1 ??C)   TempSrc: Oral   Resp: 19   Height: 5\' 3"  (1.6 m)   Weight: 263 lb (119.296 kg)   SpO2: 98%        CONSTITUTIONAL: AO, no apparent distress, appears stated age   HEAD: normocephalic, atraumatic   EYES: PERRL, EOMI    ENT: external ears normal, nose normal, moist mucous membranes, uvula midline   NECK: Normal ROM, trachea midline, no JVD   BACK: symmetric   LUNGS: clear to auscultation bilaterally   CHEST: symmetric chest rise   CARDIOVASCULAR: regular rate and rhythm, no murmurs, equal radial pulses bilaterally    ABDOMEN: soft, non-tender, non-distended, bowel sounds normoactive, no rebound, no guarding   GU: deferred     NEUROLOGIC:  GCS 15, MAEx4, no focal sensory or motor deficits   MUSCULOSKELETAL: no deformities, no edema, Wrist is not swollen, full range of motion, good cap refill, strong radial pulse,  pain on palpation of the wrist, no bony abnormality   SKIN: Warm, dry, no rash         DIAGNOSTIC RESULTS     EKG: All EKG's are interpreted by the Emergency Department Physician who  either signs or Co-signs this chart in the absence of a cardiologist.        RADIOLOGY/LABS::   I directly visualized the following  images and reviewed the radiologist interpretations:  XR WRIST RIGHT STANDARD    Final Result:          No acute    XR WRIST RIGHT STANDARD  No results found.  No results found for this visit on 08/24/13.    BEDSIDE US:    DIFFERENTIAL DIAGNOSIS/MDM:     Right wrist pain.  Chronic.  Unlikely to find a diagnosis here in the ED as she is guarding had EMG by Neurology and diagnosed with carpal tunnel syndrome.  I discussed with the patient that carpal tunnel releases the next step in treatment for carpal tunnel.  She'll follow up with her neurologist or PCP for this discussion.    PLAN:  Orders Placed This Encounter   Procedures   ??? XR Wrist Right Standard       MEDICATIONS GIVEN:  No orders of the defined types were placed in this encounter.           EMERGENCY DEPARTMENT COURSE:       CONSULTS:  None    CRITICAL CARE:    PROCEDURES:  Patient consented to procedures (if applicable), listed in the Differential Diagnosis/ MDM section of this note. (see above)    FINAL IMPRESSION      1. Wrist pain, chronic, right          DISPOSITION/PLAN     DISPOSITION Decision to Discharge    PATIENT REFERRED TO:  No follow-up provider specified.    DISCHARGE MEDICATIONS:  Discharge Medication List as of 08/24/2013 11:40 PM          Patient was instructed to return to the emergency department if symptoms do not improve or worsen.      (Please note that portions of this note were completed with a voice recognition program.  Efforts were made to edit the dictations but occasionally words  are mis-transcribed.)    Geraldine Contras, MD  Emergency Medicine Resident Physician        Hardin Negus, MD  Resident  08/25/13 [phone]

## 2013-08-24 NOTE — Discharge Instructions (Signed)
Developing a Pain Management Plan: After Your Visit  Your Care Instructions  Some diseases and injuries can cause pain that lasts a long time. You don't need to live with uncontrolled pain. A pain management plan helps you find ways to control pain with side effects you can live with.  There are things you can do to help with pain. Only you know how much pain you feel. Constant pain can make you depressed. It can cause stress and make it hard for you to eat and sleep. But there are ways to control the pain. They can help you stay active, improve your mood, and heal faster.  Your plan can include many types of pain control. You may take prescription or over-the-counter drugs. You can also try physical treatments and behavioral methods. Some medical treatments also help with pain. For example, radiation can be used to reduce pain from bone cancer.  You and your doctor will work to make your plan. Be sure to read it often. Change it if your pain is not under control.  Follow-up care is a key part of your treatment and safety. Be sure to make and go to all appointments, and call your doctor if you are having problems. It's also a good idea to know your test results and keep a list of the medicines you take.  How can you care for yourself at home?  Physical treatments   Take pain medicines exactly as directed.   If the doctor gave you a prescription medicine for pain, take it exactly as prescribed.   If you are not taking a prescription pain medicine, ask your doctor if you can take an over-the-counter medicine.   If your pain medicine causes side effects such as constipation or nausea, you may need to take other medicines for those problems. Talk to your doctor about any side effects you have.   Hot or cold compresses applied directly to the skin can help sore muscles. Put ice or a cold pack on the painful area for 10 to 20 minutes at a time. Put a thin cloth between the ice and your skin. You may find that it  helps to switch between cold and heat. Try a heating pad set on low or a warm cloth on the area for 10 to 15 minutes at a time.   Hydrotherapy uses flowing water to relax muscles. You may want to sit in a hot tub or a steam bath. Or use a shower or a sitz bath to help your pain.   Massage therapy is rubbing the soft tissues of the body. It eases tension and pain. It also improves blood flow and helps you relax.   Transcutaneous electrical nerve stimulation (TENS) uses a gentle electric current applied to the skin for pain relief. You can use TENS at home after you learn how.   Acupuncture is a form of traditional Chinese medicine. It uses very thin needles inserted into certain points of the body. It is done by a person with special training (acupuncturist).   If you get physical therapy, make sure to do any home exercises or stretching your therapist has prescribed.   Stay as active as you can. Try to get some physical activity every day.  Behavioral treatments    Biofeedback teaches you to control a body function that you normally don't think about. These are things like skin temperature, heart rate, and blood pressure. At first, you will use a machine to give you feedback on   the function you want to control. Then a therapist will teach you what to do next. Over time, you can stop using the machine.   Breathing techniques can help you relax and get rid of tension.   Guided imagery is a series of thoughts and images that can focus your attention away from your pain. When you do it, you use all your senses to help your body respond as though what you are imagining is real.   Hypnosis is a state of focused concentration that makes you less aware of your surroundings. It may cause your brain to release chemicals that relieve pain. You can have a therapist help you through hypnosis. Or you can learn to use it on yourself.   Cognitive behavioral therapy is a type of counseling. It helps you change your  thought patterns. Changes in your thoughts can help change your behavior and the way you perceive your body.  Other treatments and ideas   Aromatherapy uses the scent of oils obtained from plants to help you relax or to relieve stress. The pleasant smell may cause the body to produce substances that relieve pain.   Meditation focuses your attention to give a clear awareness of your life. You sit quietly, focus on one image or sound, and breathe deeply.   Yoga uses stretching and exercises (called postures) to reduce stress and improve flexibility and health.   Keep track of your pain in a pain diary. This can help you understand how the things you do affect your pain.   In rare cases, your doctor may advise you to get a nerve block to help with pain. A nerve block is a shot of medicine to interrupt pain signals to your brain.   Some hospitals have special pain clinics or centers. Your doctor may refer you to a pain clinic or center. Or you can ask your doctor about them.   Where can you learn more?   Go to [email or web address] and sign in to your MyChart account. Enter [phone] in the Search Health Information box to learn more about "Developing a Pain Management Plan: After Your Visit."    If you do not have an account, please click on the "Sign Up Now" link.      2006-2014 [employer]. Care instructions adapted under license by [facility]. This care instruction is for use with your licensed healthcare professional. If you have questions about a medical condition or this instruction, always ask your healthcare professional. Health[employer], Inc[employer] disclaims any warranty or liability for your use of this information.  Content Version: 10.1.311062; Current as of: January 13, 2013    [facility]   Specialty Clinics    [phone]   Burn / Plastic, ENT, GI, Orthopedics, (Orthopedics D.O.), Surgical, TRAUMA, Urology, Vascular    Call for  appointment              Carpal Tunnel Release: Before Your Surgery  What is carpal tunnel release?     Carpal tunnel release is surgery that reduces the pressure on a nerve in the wrist. Your doctor will cut a ligament that presses on the nerve. This lets the nerve pass freely through the tunnel without being squeezed.  The surgery can be open or endoscopic. In open surgery, your doctor makes a small cut in the palm of your hand. This cut is called an incision. In endoscopic surgery, your doctor makes one small incision in the wrist, or one small incision  in the wrist and one in the palm. Your doctor puts a lighted tube into the incision. The tube is called an endoscope, or scope. Surgical tools are put in along with the endoscope.  In both types of surgeries, the incisions are closed with stitches. The incisions leave scars that usually fade in time.  You may be asleep during the surgery. Or you may be awake and have medicine to numb your hand and arm so you will not feel pain.  After surgery, your wrist and hand pain should begin to go away. It usually takes 3 to 4 months to recover and 1 year before your hand strength returns. How much hand strength returns is different for each person.  You will go home the same day as the surgery. When you can return to work depends on the type of work you do.  Follow-up care is a key part of your treatment and safety. Be sure to make and go to all appointments, and call your doctor if you are having problems. It's also a good idea to know your test results and keep a list of the medicines you take.  What happens before surgery?  Surgery can be stressful. This information will help you understand what you can expect. And it will help you safely prepare for surgery.  Preparing for surgery   Understand exactly what surgery is planned, along with the risks, benefits, and other options.   Tell your doctors ALL the medicines, vitamins, supplements, and herbal remedies you take.  Some of these can increase the risk of bleeding or interact with anesthesia.   If you take blood thinners, such as warfarin (Coumadin), clopidogrel (Plavix), or aspirin, be sure to talk to your doctor. He or she will tell you if you should stop taking these medicines before your surgery. Make sure that you understand exactly what your doctor wants you to do.   Your doctor will tell you which medicines to take or stop before your surgery. You may need to stop taking certain medicines a week or more before surgery. So talk to your doctor as soon as you can.   If you have an advance directive, let your doctor know. It may include a living will and a durable power of attorney for health care. Bring a copy to the hospital. If you don't have one, you may want to prepare one. It lets your doctor and loved ones know your health care wishes. Doctors advise that everyone prepare these papers before any type of surgery or procedure.  What happens on the day of surgery?   Follow the instructions exactly about when to stop eating and drinking. If you don't, your surgery may be canceled. If your doctor told you to take your medicines on the day of surgery, take them with only a sip of water.   Take a bath or shower before you come in for your surgery. Do not apply lotions, perfumes, deodorants, or nail polish.   Do not shave the surgical site yourself.   Take off all jewelry and piercings. And take out contact lenses, if you wear them.  At the hospital or surgery center   Bring a picture ID.   The area for surgery is often marked to make sure there are no errors.   You will be kept comfortable and safe by your anesthesia provider. The anesthesia may make you sleep. Or it may just numb the area being worked on.   The surgery will take  about 15 to 60 minutes.  Going home   Be sure you have someone to drive you home. Anesthesia and pain medicine make it unsafe for you to drive.   You will be given more specific  instructions about recovering from your surgery. They will cover things like diet, wound care, follow-up care, driving, and getting back to your normal routine.  When should you call your doctor?   You have questions or concerns.   You don't understand how to prepare for your surgery.   You become ill before the surgery (such as fever, flu, or a cold).   You need to reschedule or have changed your mind about having the surgery.   Where can you learn more?   Go to [email or web address] and sign in to your MyChart account. Enter [phone] in the Search Health Information box to learn more about "Carpal Tunnel Release: Before Your Surgery."    If you do not have an account, please click on the "Sign Up Now" link.      2006-2014 [employer], [employer]. Care instructions adapted under license by [facility]. This care instruction is for use with your licensed healthcare professional. If you have questions about a medical condition or this instruction, always ask your healthcare professional. [employer], Inc[employer] disclaims any warranty or liability for your use of this information.  Content Version: 10.1.311062; Current as of: February 07, 2012

## 2013-08-24 NOTE — ED Notes (Signed)
Patient is having right wrist pain. Patient has a history of carpel tunnel in the right wrist. Patient has full ROM. PMS intact.    Antonietta Jewel, RN  08/24/13 [phone]

## 2013-08-25 ENCOUNTER — Inpatient Hospital Stay
Admit: 2013-08-25 | Discharge: 2013-08-25 | Disposition: A | Discharge status: Home / Self Care | Attending: Emergency Medicine

## 2013-08-31 NOTE — ED Provider Notes (Signed)
[facility] ED     Emergency Department     Faculty Attestation        I performed a history and physical examination of the patient and discussed management with the resident. I reviewed the resident???s note and agree with the documented findings and plan of care. Any areas of disagreement are noted on the chart. I was personally present for the key portions of any procedures. I have documented in the chart those procedures where I was not present during the key portions. I have reviewed the emergency nurses triage note. I agree with the chief complaint, past medical history, past surgical history, allergies, medications, social and family history as documented unless otherwise noted below. Documentation of the HPI, Physical Exam and Medical Decision Making performed by medical students or scribes is based on my personal performance of the HPI, PE and MDM. For Physician Assistant/ Nurse Practitioner cases/documentation I have personally evaluated this patient and have completed at least one if not all key elements of the E/M (history, physical exam, and MDM). Additional findings are as noted.    Vital Signs: BP 140/94   Pulse 95   Temp(Src) 97 ??F (36.1 ??C) (Oral)   Resp 19   Ht 5\' 3"  (1.6 m)   Wt 263 lb (119.296 kg)   BMI 46.6 kg/m2   SpO2 98%  PCP:  No primary provider on file.    Pertinent Comments:     Wrist pain, outpatient follow-up    Critical Care  None    (Please note that portions of this note were completed with a voice recognition program.  Efforts were made to edit the dictations but occasionally words are mis-transcribed.)    Hubert Azure, DO Lacie Scotts  Attending Emergency Medicine Physician                Hubert Azure, DO  08/31/13 2340

## 2013-10-20 NOTE — Telephone Encounter (Signed)
Dr. Ardyth ManHickey is referring patient to you for bilat carpal tunnel right greater then left. EMG's scanned in media. Patient has paramount advantage, okay to schedule?

## 2013-10-21 NOTE — Telephone Encounter (Signed)
Updated the referral for scheduling.

## 2013-10-21 NOTE — Telephone Encounter (Signed)
Ok schedule next

## 2013-11-11 NOTE — Progress Notes (Signed)
Subjective:      Patient ID: Linda Gilbert is a 28 y.o. female.    HPI    Review of Systems    Objective:   Physical Exam  Diagnostic x-rays AP and lateral right wrist obtained and reviewed by myself in clinic today normal  Assessment:            Plan:

## 2013-11-11 NOTE — Addendum Note (Signed)
Addended by: Warden FillersVISLAY, CHERYL on: 11/11/2013 02:19 PM     Modules accepted: Orders

## 2013-11-11 NOTE — Progress Notes (Signed)
Subjective:      Patient ID: Patton Sallesnita Bress is a 28 y.o. female.    Hand Pain   The incident occurred more than 1 week ago. The incident occurred at home. There was no injury mechanism. The pain is present in the right hand. The quality of the pain is described as burning, aching and stabbing. The pain radiates to the right hand. The pain is severe. The pain has been worsening since the incident. Associated symptoms include numbness and tingling. The symptoms are aggravated by movement. She has tried rest and immobilization for the symptoms. The treatment provided mild relief.     Patient with chronic right hand pain    Patient describes numbness and tingling predominantly to from the index to the ring finger worse in the tips    Patient reports that she is worn out a cockup wrist splint    Unfortunately in addition to carpal tunnel syndrome symptoms patient has multiple other complaints of pain involving the hand and the wrist    Patient reports that she frequently drops objects, her hand goes to sleep when talking on the telephone and frequently awakens her from  Sleep  Review of Systems   Neurological: Positive for tingling and numbness.       Objective:   Physical Exam   Constitutional: She is oriented to person, place, and time. She appears well-developed and well-nourished.   HENT:   Head: Normocephalic and atraumatic.   Eyes: Conjunctivae and EOM are normal.   Neck: Normal range of motion.   Pulmonary/Chest: Effort normal. No respiratory distress.   Neurological: She is alert and oriented to person, place, and time. She has normal strength. No sensory deficit.   Normal gait   Skin: Skin is warm and dry.   Psychiatric: Her behavior is normal. Thought content normal.   Nursing note and vitals reviewed.    Examination of hand reveals no thenar atrophy patient has some ligamentous laxity and it PIP joints; patient with moderate Phalen's with provocation at 15-20 seconds but without improvement with wrist  extension  Assessment:      Encounter Diagnoses   Name Primary?   ??? Pain in joint, hand    ??? Carpal tunnel syndrome Yes   ??? Chronic pain    ??? Obesity, Class III, BMI 40-49.9 (morbid obesity) (HCC)             Plan:      Okay to schedule carpal tunnel release

## 2013-11-24 LAB — CBC WITH AUTO DIFFERENTIAL
Absolute Eos #: 0.7 10*3/uL — ABNORMAL HIGH (ref 0.0–0.4)
Absolute Lymph #: 3.1 10*3/uL (ref 1.0–4.8)
Absolute Mono #: 0.5 10*3/uL (ref 0.1–1.3)
Basophils Absolute: 0.1 10*3/uL (ref 0.0–0.2)
Basophils: 1 % (ref 0–2)
Eosinophils %: 8 % — ABNORMAL HIGH (ref 0–4)
Hematocrit: 37.3 % (ref 36–46)
Hemoglobin: 12.5 g/dL (ref 12.0–16.0)
Lymphocytes: 34 % (ref 24–44)
MCH: 30.3 pg (ref 26–34)
MCHC: 33.4 g/dL (ref 31–37)
MCV: 90.7 fL (ref 80–100)
MPV: 9.2 fL (ref 6.0–12.0)
Monocytes: 5 % (ref 1–7)
Platelets: 244 10*3/uL (ref 150–450)
RBC: 4.12 m/uL (ref 4.0–5.2)
RDW: 14 % (ref 11.5–14.9)
Seg Neutrophils: 52 % (ref 36–66)
Segs Absolute: 4.7 10*3/uL (ref 1.3–9.1)
WBC: 9.1 10*3/uL (ref 3.5–11.0)

## 2013-11-24 LAB — BASIC METABOLIC PANEL
Anion Gap: 17 mmol/L
BUN: 12 mg/dL (ref 6–20)
CO2: 25 mmol/L (ref 20–31)
Calcium: 9.2 mg/dL (ref 8.6–10.4)
Chloride: 104 mmol/L (ref 98–107)
Creatinine: 0.49 mg/dL — ABNORMAL LOW (ref 0.50–0.90)
GFR African American: >60 mL/min (ref >60)
GFR Non-African American: >60 mL/min (ref >60)
Glucose: 94 mg/dL (ref 70–99)
Potassium: 4.2 mmol/L (ref 3.7–5.3)
Sodium: 142 mmol/L (ref 135–144)

## 2013-11-24 NOTE — H&P (View-Only) (Signed)
HISTORY and PHYSICAL  [facility]       NAME:  Linda Gilbert  MRN: [id number]   Date of Birth:  [birth year]   Date: 11/24/2013   Age: 28 y.o.  Gender: female       COMPLAINT AND PRESENT HISTORY:   Pt here for right carpal tunnel release. Pt has N/T and weak grip in bilateral hands.      PAST MEDICAL HISTORY     Past Medical History   Diagnosis Date   ??? Kidney stone    ??? Depression    ??? Bipolar 1 disorder (HCC)    ??? Migraines    ??? Wears glasses    ??? Dizziness    ??? Numbness and tingling      hands   ??? Nausea      due to migraines and pain related   ??? UTI (urinary tract infection)    ??? GERD (gastroesophageal reflux disease)    ??? Constipation    ??? Morbid obesity with BMI of 45.0-49.9, adult (HCC)        Pt denies any history of Diabetes mellitus type 2, hypertension, stroke, heart disease, COPD, Asthma, HLD, Cancer, Seizures,Thyroid disease,  Hepatitis, TB or Substance abuse.    SURGICAL HISTORY       Past Surgical History   Procedure Laterality Date   ??? Knee surgery Bilateral      4 surgeries on each knee   ??? Appendectomy     ??? Tumor removal Left      mid back   ??? Ovarian cyst removal Bilateral    ??? Tonsillectomy     ??? Tubal ligation Bilateral    ??? Upper gastrointestinal endoscopy     ??? Cholecystectomy       stent also in bile duct       FAMILY HISTORY       Family History   Problem Relation Age of Onset   ??? Liver Cancer Maternal Aunt      died at 69 yrs old   ??? Lung Cancer Maternal Grandfather    ??? Cancer Paternal Grandfather    ??? Kidney Disease Maternal Aunt    ??? Thyroid Disease Mother        SOCIAL HISTORY       History     Social History   ??? Marital Status: Single     Spouse Name: N/A     Number of Children: N/A   ??? Years of Education: N/A     Social History Main Topics   ??? Smoking status: Current Every Day Smoker -- 0.75 packs/day for 12 years     Types: Cigarettes   ??? Smokeless tobacco: Never Used   ??? Alcohol Use: No   ??? Drug Use: No   ??? Sexual Activity: Yes     Other Topics Concern   ??? None      Social History Narrative           REVIEW OF SYSTEMS      Allergies   Allergen Reactions   ??? Dilaudid [Hydromorphone]    ??? Tape [place] Organ Tape]    ??? Vicodin [Hydrocodone-Acetaminophen]        Current Outpatient Prescriptions on File Prior to Encounter   Medication Sig Dispense Refill   ??? oxyCODONE-acetaminophen (PERCOCET) 10-325 MG per tablet Take 1 tablet by mouth every 6 hours as needed for Pain.       ??? morphine (KADIAN) 30 MG SR capsule  Take 30 mg by mouth daily.       ??? ibuprofen (ADVIL;MOTRIN) 800 MG tablet Take 1 tablet by mouth every 8 hours as needed for Pain for 20 doses.  20 tablet  0     No current facility-administered medications on file prior to encounter.        General health:  Fairly good.  No fever or chills.                 Skin:  No itching, redness or rash.                 HEENT:  No headache, epistaxis or sore throat.                 Neck:  No pain, stiffness or masses.                 Cardiovascular/Respiratory system:  No chest pain, palpitation or shortness of breath.                         Gastrointestinal tract: No abdominal pain, nausea, vomiting, diarrhea or constipation.              Genitourinary:  No burning on micturition.  No hesitancy, urgency, frequency or discoloration of urine.            Locomotor:      See HPI.           Neuropsychiatric:  No referable complaints.                                                GENERAL PHYSICAL EXAM:     Vitals: BP 128/69    Pulse 87    Temp(Src) 97.3 ??F (36.3 ??C)    Resp 20    Ht 5\' 3"  (1.6 m)    Wt 261 lb (118.389 kg)    BMI 46.25 kg/m2      SpO2 98%    LMP 11/15/2013    Body mass index is 46.25 kg/(m^2).       GENERAL APPEARANCE:   KELIE GAINEY is 28 y.o.,Caucasian  female, severely obese, nourished, conscious, alert.  Does not appear to be distress or pain at this time.                            SKIN:  Warm, dry, no cyanosis or jaundice.              HEAD:  Normocephalic, atraumatic, no swelling or tenderness.                 EYES:   Pupils equal, reactive to light.           EARS:  No discharge, no marked hearing loss.               NOSE:  No rhinorrhea, epistaxis or septal deformity.                 THROAT:  Not congested. No ulceration bleeding or discharge.                  NECK:  No stiffness, trachea central.  No palpable masses or L.N.  CHEST:  Symmetrical and equal on expansion.                 HEART:  RRR S1 > S2. No audible murmurs or gallops.                 LUNGS:  Equal on expansion, normal breath sounds.  No adventitious sounds.           ABDOMEN:  Obese.  Soft on palpation.  No localized tenderness.  No guarding or rigidity. No palpable organomegaly.              LYMPHATICS:  No palpable cervical lymphadenopathy.     LOCOMOTOR, BACK AND SPINE:  No tenderness or deformities.                 EXTREMITIES:  Symmetrical, no pedal edema.  Homan???s sign negative.  No discoloration or ulcerations.    NEUROLOGIC:  The patient is conscious, alert, oriented, No apparent focal sensory or motor deficits.except for weakness in grip 4/5 in bilateral hands                                                                                     PROVISIONAL DIAGNOSES / SURGERY:      Right carpal tunnel syndrome    Right carpal tunnel release    Patient Active Problem List    Diagnosis Date Noted   ??? Chronic pain 11/11/2013   ??? Carpal tunnel syndrome 11/11/2013   ??? Pain in joint, hand 11/11/2013   ??? Obesity, Class III, BMI 40-49.9 (morbid obesity) (HCC) 11/11/2013           Fayrene FearingMichelle D Nneoma Harral, PA-C on 11/24/2013 at 3:20 PM

## 2013-11-24 NOTE — Discharge Instructions (Signed)
Pre-op Instructions For Outpatient Surgery    MEDICATION INSTUCTIONS:   Please stop herbs and supplements 14 days before surgery.     If you are taking any blood thinners, please contact your surgeon and prescribing physician for pre-op instructions.     If you have inhalers or aerosol treatments at home, please use your prescribed inhalers/aerosol treatment before surgery and bring the inhalers with you to the hospital.     Please take the following medications the morning of your surgery with a sip of water:    NONE    SURGERY INSTRUCTIONS  1. After midnight before surgery:  . Do not eat or drink anything, including water, mints, gum, and hard candy.  . You may brush your teeth without swallowing.  . Do not smoke or chew tobacco.  . No street drugs.    2. Please shower or bathe before surgery.  If you were given Surgical Prep Chlorhexidine Cloths, please use them the night before surgery as explained and provided to you during your pre-admission testing visit.    (CHG cloths provided?  Yes)      3. You may wear deodorant, unless instructed otherwise. Wear loose comfortable clothing.    4. Please do not wear any cologne, powder, lotion, jewelry, piercings, makeup, nail polish, or hair accessories on the day of surgery.    5. Leave your valuables at home.  Bring a storage case for any glasses/contacts.    6. An adult must drive you home if you are having anesthesia or sedation.    7. It is recommended that an adult be with you for the first 24 hours after your surgery.     8. For out-patient knee and foot surgeries, please arrange for planned crutches, walker, or wheelchair before arriving to the hospital    THE DAY OF SURGERY  . Arrive at [facility] at the time directed by your surgeon and check in at the desk.    . If you have a living will or healthcare power of attorney, please bring a copy with you.    . You will be taken to the pre-op holding area where you will be  prepared for surgery.  A physical assessment will be performed by a nurse practitioner or house officer.  This is where your IV will be started and you will meet your anesthesiologist.      . For your comfort and to provide very good care, we limit visitors to 2 at a time in the pre-op holding area.       . When you go to surgery, your family will be directed to the surgical waiting room, where the doctor should speak with them after your surgery.    . After surgery, you will be in the recovery room for at least one hour, then to the short stay unit for preparation to be discharged.

## 2013-11-24 NOTE — H&P (Signed)
HISTORY and PHYSICAL  [facility]       NAME:  Linda Gilbert  MRN: [id number]   Date of Birth:  12/29/1985   Date: 11/24/2013   Age: 28 y.o.  Gender: female       COMPLAINT AND PRESENT HISTORY:   Pt here for right carpal tunnel release. Pt has N/T and weak grip in bilateral hands.      PAST MEDICAL HISTORY     Past Medical History   Diagnosis Date   ??? Kidney stone    ??? Depression    ??? Bipolar 1 disorder (HCC)    ??? Migraines    ??? Wears glasses    ??? Dizziness    ??? Numbness and tingling      hands   ??? Nausea      due to migraines and pain related   ??? UTI (urinary tract infection)    ??? GERD (gastroesophageal reflux disease)    ??? Constipation    ??? Morbid obesity with BMI of 45.0-49.9, adult (HCC)        Pt denies any history of Diabetes mellitus type 2, hypertension, stroke, heart disease, COPD, Asthma, HLD, Cancer, Seizures,Thyroid disease,  Hepatitis, TB or Substance abuse.    SURGICAL HISTORY       Past Surgical History   Procedure Laterality Date   ??? Knee surgery Bilateral      4 surgeries on each knee   ??? Appendectomy     ??? Tumor removal Left      mid back   ??? Ovarian cyst removal Bilateral    ??? Tonsillectomy     ??? Tubal ligation Bilateral    ??? Upper gastrointestinal endoscopy     ??? Cholecystectomy       stent also in bile duct       FAMILY HISTORY       Family History   Problem Relation Age of Onset   ??? Liver Cancer Maternal Aunt      died at 12 yrs old   ??? Lung Cancer Maternal Grandfather    ??? Cancer Paternal Grandfather    ??? Kidney Disease Maternal Aunt    ??? Thyroid Disease Mother        SOCIAL HISTORY       History     Social History   ??? Marital Status: Single     Spouse Name: N/A     Number of Children: N/A   ??? Years of Education: N/A     Social History Main Topics   ??? Smoking status: Current Every Day Smoker -- 0.75 packs/day for 12 years     Types: Cigarettes   ??? Smokeless tobacco: Never Used   ??? Alcohol Use: No   ??? Drug Use: No   ??? Sexual Activity: Yes     Other Topics Concern   ??? None      Social History Narrative           REVIEW OF SYSTEMS      Allergies   Allergen Reactions   ??? Dilaudid [Hydromorphone]    ??? Tape [Adhesive Tape]    ??? Vicodin [Hydrocodone-Acetaminophen]        Current Outpatient Prescriptions on File Prior to Encounter   Medication Sig Dispense Refill   ??? oxyCODONE-acetaminophen (PERCOCET) 10-325 MG per tablet Take 1 tablet by mouth every 6 hours as needed for Pain.       ??? morphine (KADIAN) 30 MG SR capsule   Take 30 mg by mouth daily.       ??? ibuprofen (ADVIL;MOTRIN) 800 MG tablet Take 1 tablet by mouth every 8 hours as needed for Pain for 20 doses.  20 tablet  0     No current facility-administered medications on file prior to encounter.        General health:  Fairly good.  No fever or chills.                 Skin:  No itching, redness or rash.                 HEENT:  No headache, epistaxis or sore throat.                 Neck:  No pain, stiffness or masses.                 Cardiovascular/Respiratory system:  No chest pain, palpitation or shortness of breath.                         Gastrointestinal tract: No abdominal pain, nausea, vomiting, diarrhea or constipation.              Genitourinary:  No burning on micturition.  No hesitancy, urgency, frequency or discoloration of urine.            Locomotor:      See HPI.           Neuropsychiatric:  No referable complaints.                                                GENERAL PHYSICAL EXAM:     Vitals: BP 128/69    Pulse 87    Temp(Src) 97.3 ??F (36.3 ??C)    Resp 20    Ht 5' 3" (1.6 m)    Wt 261 lb (118.389 kg)    BMI 46.25 kg/m2      SpO2 98%    LMP 11/15/2013    Body mass index is 46.25 kg/(m^2).       GENERAL APPEARANCE:   Linda Gilbert is 28 y.o.,Caucasian  female, severely obese, nourished, conscious, alert.  Does not appear to be distress or pain at this time.                            SKIN:  Warm, dry, no cyanosis or jaundice.              HEAD:  Normocephalic, atraumatic, no swelling or tenderness.                 EYES:   Pupils equal, reactive to light.           EARS:  No discharge, no marked hearing loss.               NOSE:  No rhinorrhea, epistaxis or septal deformity.                 THROAT:  Not congested. No ulceration bleeding or discharge.                  NECK:  No stiffness, trachea central.  No palpable masses or L.N.                   CHEST:  Symmetrical and equal on expansion.                 HEART:  RRR S1 > S2. No audible murmurs or gallops.                 LUNGS:  Equal on expansion, normal breath sounds.  No adventitious sounds.           ABDOMEN:  Obese.  Soft on palpation.  No localized tenderness.  No guarding or rigidity. No palpable organomegaly.              LYMPHATICS:  No palpable cervical lymphadenopathy.     LOCOMOTOR, BACK AND SPINE:  No tenderness or deformities.                 EXTREMITIES:  Symmetrical, no pedal edema.  Homan???s sign negative.  No discoloration or ulcerations.    NEUROLOGIC:  The patient is conscious, alert, oriented, No apparent focal sensory or motor deficits.except for weakness in grip 4/5 in bilateral hands                                                                                     PROVISIONAL DIAGNOSES / SURGERY:      Right carpal tunnel syndrome    Right carpal tunnel release    Patient Active Problem List    Diagnosis Date Noted   ??? Chronic pain 11/11/2013   ??? Carpal tunnel syndrome 11/11/2013   ??? Pain in joint, hand 11/11/2013   ??? Obesity, Class III, BMI 40-49.9 (morbid obesity) (HCC) 11/11/2013           Guila Owensby D Mylene Bow, PA-C on 11/24/2013 at 3:20 PM

## 2013-12-08 LAB — PREGNANCY, URINE: HCG(Urine) Pregnancy Test: NEGATIVE

## 2013-12-08 MED ORDER — DIPHENHYDRAMINE HCL 50 MG/ML IJ SOLN
50 MG/ML | Freq: Once | INTRAMUSCULAR | Status: DC | PRN
Start: 2013-12-08 — End: 2013-12-09

## 2013-12-08 MED ORDER — ONDANSETRON HCL 4 MG/2ML IJ SOLN
4 MG/2ML | Freq: Once | INTRAMUSCULAR | Status: AC | PRN
Start: 2013-12-08 — End: 2013-12-08
  Administered 2013-12-08: 17:00:00 4 mg via INTRAVENOUS

## 2013-12-08 MED ORDER — NORMAL SALINE FLUSH 0.9 % IV SOLN
0.9 % | INTRAVENOUS | Status: DC | PRN
Start: 2013-12-08 — End: 2013-12-09

## 2013-12-08 MED ORDER — HYDROCODONE-ACETAMINOPHEN 5-325 MG PO TABS
5-325 MG | ORAL_TABLET | ORAL | Status: AC | PRN
Start: 2013-12-08 — End: 2013-12-15

## 2013-12-08 MED ORDER — LIDOCAINE-EPINEPHRINE 1 %-1:100000 IJ SOLN
1 %-:00000 | INTRAMUSCULAR | Status: AC
Start: 2013-12-08 — End: ?

## 2013-12-08 MED ORDER — LACTATED RINGERS IV SOLN
INTRAVENOUS | Status: DC
Start: 2013-12-08 — End: 2013-12-09
  Administered 2013-12-08: 16:00:00 via INTRAVENOUS

## 2013-12-08 MED ORDER — BUPIVACAINE-EPINEPHRINE (PF) 0.25% -1:200000 IJ SOLN
INTRAMUSCULAR | Status: AC
Start: 2013-12-08 — End: ?

## 2013-12-08 MED ORDER — MEPERIDINE HCL 25 MG/ML IJ SOLN
25 MG/ML | INTRAMUSCULAR | Status: DC | PRN
Start: 2013-12-08 — End: 2013-12-09

## 2013-12-08 MED ORDER — MORPHINE SULFATE (PF) 4 MG/ML IV SOLN
4 | INTRAVENOUS | Status: DC | PRN
Start: 2013-12-08 — End: 2013-12-09
  Administered 2013-12-08: 17:00:00 4 mg via INTRAVENOUS

## 2013-12-08 MED ORDER — NORMAL SALINE FLUSH 0.9 % IV SOLN
0.9 % | Freq: Two times a day (BID) | INTRAVENOUS | Status: DC
Start: 2013-12-08 — End: 2013-12-09

## 2013-12-08 MED ORDER — CEFAZOLIN 2000 MG D5W 50 ML IVPB
Freq: Once | Status: AC
Start: 2013-12-08 — End: 2013-12-08
  Administered 2013-12-08: 16:00:00 2 g via INTRAVENOUS

## 2013-12-08 MED FILL — SENSORCAINE-MPF/EPINEPHRINE 0.25% -1:200000 IJ SOLN: INTRAMUSCULAR | Qty: 30

## 2013-12-08 MED FILL — MORPHINE SULFATE (PF) 4 MG/ML IV SOLN: 4 mg/mL | INTRAVENOUS | Qty: 1

## 2013-12-08 MED FILL — ONDANSETRON HCL 4 MG/2ML IJ SOLN: 4 MG/2ML | INTRAMUSCULAR | Qty: 2

## 2013-12-08 MED FILL — LIDOCAINE-EPINEPHRINE 1 %-1:100000 IJ SOLN: 1 %-:00000 | INTRAMUSCULAR | Qty: 20

## 2013-12-08 NOTE — Brief Op Note (Signed)
Brief Postoperative Note    Linda Gilbert  Date of Birth:  1986-05-03  [id number]    Pre-operative Diagnosis: Right CTS    Post-operative Diagnosis: Same    Procedure: right CTR    Anesthesia: General    Surgeons/Assistants: Leean Amezcua    Estimated Blood Loss: less than 50     Complications: None    Specimens: Was Not Obtained    Findings: see above    Electronically signed by Virgie Dadavid A Mandolin Falwell, MD on 12/08/2013 at 11:41 AM

## 2013-12-08 NOTE — Anesthesia Pre-Procedure Evaluation (Signed)
Linda AgeeAnita M Gilbert    Department of Anesthesiology  Pre-Anesthesia Evaluation/Consultation         Name:  Linda Gilbert                                         Age:  28 y.o.  MRN:  [id number]       DOB:  1986-10-27   Date:  12/08/2013      Procedure (Scheduled):  Right carpal tunnel release  Surgeon:  Dr. Dion BodyBeeks    Medications  Current Facility-Administered Medications   Medication Dose Route Frequency Provider Last Rate Last Dose   ??? lactated ringers infusion   Intravenous Continuous Sukhwinder Shiela MayerS Gill, MD       ??? sodium chloride flush 0.9 % injection 10 mL  10 mL [facility]venous [facility] Sukhwinder Shiela MayerS Gill, MD       ??? sodium chloride flush 0.9 % injection 10 mL  10 mL Intravenous PRN Sukhwinder Shiela MayerS Gill, MD           Allergies   Allergen Reactions   ??? Dilaudid [Hydromorphone]    ??? Tape Virgina Organ[Adhesive Tape]    ??? Vicodin [Hydrocodone-Acetaminophen]      Patient Active Problem List   Diagnosis   ??? Chronic pain   ??? Carpal tunnel syndrome   ??? Pain in joint, hand   ??? Obesity, Class III, BMI 40-49.9 (morbid obesity) (HCC)     Past Medical History   Diagnosis Date   ??? Kidney stone    ??? Depression    ??? Bipolar 1 disorder (HCC)    ??? Migraines    ??? Wears glasses    ??? Dizziness    ??? Numbness and tingling      hands   ??? Nausea      due to migraines and pain related   ??? UTI (urinary tract infection)    ??? GERD (gastroesophageal reflux disease)    ??? Constipation    ??? Morbid obesity with BMI of 45.0-49.9, adult [facility](HCC)      Past Surgical History   Procedure Laterality Date   ??? Knee surgery Bilateral      4 surgeries on each knee   ??? Appendectomy     ??? Tumor removal Left      mid back   ??? Ovarian cyst removal Bilateral    ??? Tonsillectomy     ??? Tubal ligation Bilateral    ??? Upper gastrointestinal endoscopy     ??? Cholecystectomy       stent also in bile duct     History   Substance Use Topics   ??? Smoking status: Current Every Day Smoker -- 0.75 packs/day for 12 years     Types: Cigarettes   ??? Smokeless tobacco: Never Used   ??? Alcohol Use: No       Vital Signs  (Current) BP 120/78    Pulse 88    Temp(Src) 97.3 ??F (36.3 ??C) (Oral)    Resp 18    Ht 5\' 3"  (1.6 m)    Wt 261 lb (118.389 kg)    BMI 46.25 kg/m2      SpO2 97%    LMP 11/15/2013        Vital Signs Statistics (for past 48 hrs)     BP  Min: 120/78   Min taken time: 12/08/13 1055  Max: 120/78  Max taken time: 12/08/13 1055  Temp  Avg: 97.3 ??F (36.3 ??C)  Min: 97.3 ??F (36.3 ??C)   Min taken time: 12/08/13 1055  Max: 97.3 ??F (36.3 ??C)   Max taken time: 12/08/13 1055  Pulse  Avg: 88  Min: 88   Min taken time: 12/08/13 1055  Max: 88   Max taken time: 12/08/13 1055  Resp  Avg: 18  Min: 18   Min taken time: 12/08/13 1055  Max: 18   Max taken time: 12/08/13 1055  SpO2  Avg: 97 %  Min: 97 %   Min taken time: 12/08/13 1055  Max: 97 %   Max taken time: 12/08/13 1055    BMI:   Wt Readings from Last 3 Encounters:   12/08/13 261 lb (118.389 kg)   11/24/13 261 lb (118.389 kg)   08/24/13 263 lb (119.296 kg)     Body mass index is 46.25 kg/(m^2).    CBC:   Lab Results   Component Value Date    WBC 9.1 11/24/2013    WBC 15.7 11/11/2010    RBC 4.12 11/24/2013    RBC 4.15 11/11/2010    HGB 12.5 11/24/2013    HCT 37.3 11/24/2013    MCV 90.7 11/24/2013    RDW 14.0 11/24/2013    PLT 244 11/24/2013    PLT 293 11/11/2010       CMP:   Lab Results   Component Value Date    NA 142 11/24/2013    K 4.2 11/24/2013    CL 104 11/24/2013    CO2 25 11/24/2013    BUN 12 11/24/2013    CREATININE 0.49 11/24/2013    CREATININE 0.65 11/11/2010    GFRAA >60 11/24/2013    LABGLOM >60 11/24/2013    GLUCOSE 94 11/24/2013    GLUCOSE 105 11/11/2010    CALCIUM 9.2 11/24/2013       POC Tests: No results found for this basename: POCGLU, POCNA, POCK, POCCL, POCBUN, POCHEMO, POCHCT,  in the last 72 hours    Coags    No results found for this basename: PROTIME, INR, APTT       HCG (If Applicable)   Lab Results   Component Value Date    PREGTESTUR NEGATIVE 04/14/2012        ABGs   No results found for this basename: PHART, PO2ART, PCO2ART, HCO3ART, BEART, O2SATART        Type & Screen (If  Applicable)  No results found for this basename: LABABO, [facility]       Radiology:  Appropriate reports and / or films reviewed.    Cardiac Testing: Appropriate reports and / or films reviewed.    EKG: Reviewed where appropriate    Lorrin Goodell, MD              Anesthesia Evaluation     Patient summary reviewed    Airway   Mallampati: III  TM distance: >3 FB  Neck ROM: full  Dental      Pulmonary - normal exam   ROS comment: Smoker 9 pack years    Cardiovascular - normal exam    Neuro/Psych    (+) headaches, psychiatric history  Comments: Bipolar 1 disorder  Numbness and tingling  Depression  Migraines    GI/Hepatic/Renal    (+) GERD,     Comments: Kidney stone      Endo/Other      Comments: Obesity    Abdominal  - normal exam  Allergies: Dilaudid; Tape; and Vicodin    NPO Status:                              Anesthesia Plan    ASA 3     MAC     intravenous induction   Anesthetic plan and risks discussed with patient.    Plan discussed with CRNA.          Lorrin Goodell, MD  12/08/2013

## 2013-12-08 NOTE — Progress Notes (Signed)
Patient relates that she is allergic to Norco.  Dr. Dion BodyBeeks notified.  Prescription for Ultram written by Dr. Dion BodyBeeks for patient.  Prescription for Norco shredded and witnessed by Onnie GrahamS. Hudson, RN.

## 2013-12-08 NOTE — Anesthesia Post-Procedure Evaluation (Signed)
POST- ANESTHESIA EVALUATION       Pt Name: Linda Gilbert [name]  MRN: [id number]  Birthdate: Oct 19, 1986  Date of evaluation: 12/08/2013  Time:  12:48 PM      BP 134/58    Pulse 68    Temp(Src) 96.8 ??F (36 ??C) (Temporal)    Resp 17    Ht 5\' 3"  (1.6 m)    Wt 261 lb (118.389 kg)    BMI 46.25 kg/m2      SpO2 100%    LMP 11/15/2013        Consciousness Level  Awake  Cardiopulmonary Status  Stable  Pain Adequately Treated YES  Nausea / Vomiting  NO  Adequate Hydration  YES  Anesthesia Related Complications NONE      Electronically signed by Lorrin GoodellNorman G Jhonathan Desroches, MD on 12/08/2013 at 12:48 PM

## 2013-12-08 NOTE — Progress Notes (Signed)
Dr. Dion BodyBeeks here to talk to family.

## 2013-12-08 NOTE — Interval H&P Note (Signed)
H&P Update Note    H&P from 11/24/2013  reviewed and updated, Patient examined.     Vitals:    Filed Vitals:    12/08/13 1055   BP: 120/78   Pulse: 88   Temp: 97.3 ??F (36.3 ??C)   Resp: 18       No interval changes . I concur with the findings.    Melvenia BeamELLEN M Jolynn Bajorek, CNP on 12/08/2013 at 11:06 AM

## 2013-12-08 NOTE — Discharge Instructions (Signed)
Ok to discontinue dressing in 7 days and shower no further dressing required    DISCHARGE INSTRUCTIONS FOR HAND/WRIST SURGERY    In order to continue your care at home, please follow the instructions below.    For General Anesthesia  ? Do not drink any alcoholic beverages or make any legal or important decisions for 24 hours.     Diet    ? After general anesthesia, start out eating lightly (broth, soup, crackers, toast, etc.) advancing as tolerated to your usual diet.  Try to avoid spicy or greasy/fatty foods for 24 hours.   ? Drink plenty of fluids after surgery, unless you are on a fluid restriction.  Avoid milk/milk product for several hours.      Medications  ? Take medications as instructed by your surgeon.   ? Please do not take prescribed pain medication with alcoholic beverages.  ? When cleared to drive by your surgeon, please do not drive or operate machinery while taking any prescribed pain medication.    Activities  ? As instructed by your surgeon  ? Limit your activities for 24 hours  ? Avoid heavy work or sports until Programmer, applicationssurgeon approves.   ? No lifting, pushing, pulling, twisting, or pressing down on hand or wrist.  ? No driving or operating machinery until cleared by surgeon.  ? May shower as long as dressings stay dry with plastic bag coverage.    Surgery Area  ? Always keep dressings/incisions clean, dry.  ? Do not remove dressings until seen in office, unless instructed otherwise  ? To reduce swelling and pain, apply an ice pack for 20-30 min 4 times a day for 48 hours and keep surgical hand/wrist elevated above heart level with pillows.    Call your surgeon for the following:  ? You have pain that does not get better after you take pain medicine.   ? For an oral temperature (by mouth) is 101 degrees or higher, chills, or excessive sweating.  ? You have increasing and progressive bleeding or drainage from surgery site.  ? Signs of an infection:  increased swelling, redness, warmth, or hardness around  surgery area or yellow or green drainage from incision.  ? If your fingers are pale, blue or cold to touch.  ? If swelling increases while elevated.  ? Persistent nausea or vomiting and can't keep fluids down.  ? If you are unable to urinate within 8 hours of surgery.  ? Redness or swelling at IV site.  ? For any questions or concerns you may have.

## 2013-12-08 NOTE — Op Note (Signed)
[facility]                     [address] [place], [place] [id number]                                 OPERATIVE REPORT    Patient: Linda Gilbert, Linda Gilbert                 Reg#:  [id number]   MRN   [id number]  Birth Date:      08/16/1986  Patient Status:  OA                         [facility]  [facility]            [facility] [id number]                 Adm:              12/08/2013  Sex:             F                          Dis:  Attending:       Carlean Purlavid Nicholl Onstott, Gilbert.D.          Date of Surgery:  12/08/2013  Dct By:          Carlean Purlavid Zuzanna Maroney, Gilbert.D.  PCP Phys:    Document#:       [id number]:       [id number]                    Job#:             [id number]  Date/Time Typed: 12/08/2013 01:55 P         By:               Bernadene Personamm  Date/Time Dct:   12/08/2013    11:44 A      PC                P                                              Facility:         [facility]    SURGEON:  Carlean Purlavid Christopherjame Carnell, Gilbert.D.    PREOPERATIVE DIAGNOSIS:  Right carpal tunnel syndrome.    POSTOPERATIVE DIAGNOSIS:  Right carpal tunnel syndrome.    PROCEDURE PERFORMED:  Right carpal tunnel release.    ANESTHESIA:  MAC, local.    FINDINGS:  Transverse carpal ligament transected under direct visualization.    BLOOD LOSS:  Less than 5 mL.    FLUIDS:  Replacement less 1 L.    COMPLICATIONS ARISING DURING OPERATION:  None noted.    PROCEDURE IN DETAIL:  The patient was taken to the operating room, placed under conscious  sedation.  Right upper extremity was prepped and draped in usual sterile  fashion.  Peri-incisional area was injected with a mixture of 1% lidocaine  and 0.25% Marcaine with epinephrine.  Direct anterior incision was made,  carried down through skin and subcutaneous fat.  The transverse carpal  ligament was visualized and transected under direct visualization.   The  carpal canal was palpated.  Skin was reapproximated with 3-0 nylon suture.  Sterile dressing was applied. The patient was taken to recovery room in  stable condition.    COMPLICATION ARISING DURING  OPERATION:  None noted.              "In order to promptly notify physicians concerning their patients, this  unconfirmed document is being released.  It is not considered final until  reviewed and signed."        cc:    Carlean Purl, Gilbert.D.

## 2013-12-08 NOTE — Progress Notes (Signed)
13:25 Patient's family relate that they did not get to talk to Dr. Dion BodyBeeks after patient's procedure.

## 2013-12-09 MED FILL — DIPRIVAN 10 MG/ML IV EMUL: 10 MG/ML | INTRAVENOUS | Qty: 20

## 2013-12-09 MED FILL — CEFAZOLIN SODIUM 1 G IJ SOLR: 1 g | INTRAMUSCULAR | Qty: 2000

## 2013-12-09 MED FILL — MIDAZOLAM HCL 2 MG/2ML IJ SOLN: 2 MG/ML | INTRAMUSCULAR | Qty: 2

## 2013-12-09 MED FILL — FENTANYL CITRATE 0.05 MG/ML IJ SOLN: 0.05 MG/ML | INTRAMUSCULAR | Qty: 2

## 2013-12-13 NOTE — Telephone Encounter (Signed)
Patient has called multiple times today, i informed her Dr. Dion BodyBeeks was in surgery. She also stated that Norco makes her sick so he hand wrote a script for the ultram. Changing request to Ultram.

## 2013-12-13 NOTE — Telephone Encounter (Signed)
Patient calling for pain medication refill. Patient said you gave ultram after carpal tunnel surgery on 12/08/13, but i show you gave patient Norco #40. Okay to refill?

## 2013-12-13 NOTE — Telephone Encounter (Signed)
Patient has called back again. I decided to make patient a appointment for tomorrow, not normal that patient should be in that much pain after a carpal tunnel surgery.

## 2013-12-14 MED ORDER — TRAMADOL HCL 50 MG PO TABS
50 MG | ORAL_TABLET | Freq: Four times a day (QID) | ORAL | Status: DC | PRN
Start: 2013-12-14 — End: 2014-02-17

## 2013-12-14 NOTE — Progress Notes (Signed)
Subjective:      Patient ID: Linda Gilbert is a 28 y.o. female.    HPI  One week post carpal release    Reports improvement but not complete improvement i.e. The numbness and tingling is normal are constant but she still has some    Complaint of peri- incisional pain  Review of Systems    Objective:   Physical Exam  Incision looks very good    Moving Hand normally  Assessment:      Encounter Diagnosis   Name Primary?   ??? Carpal tunnel syndrome Yes            Plan:      Ultram    Followup one week    Okay to ice

## 2013-12-18 NOTE — ED Provider Notes (Signed)
[facility]  eMERGENCY dEPARTMENT eNCOUnter   Attending Attestation     Pt Name: Linda Gilbert  MRN: [id number]  Birthdate [birth year]  Date of evaluation: 12/18/2013       Linda Gilbert is a 28 y.o. female who presents with Incisional Pain; and Drainage from Incision      History: 28 year old female presents with the possible wound infection of the right incision.  Patient had a carpal tunnel surgery done 10 days ago and states that she noticed a little bit of pus drainage this morning.  Patient has any redness, patient's is some increased swelling on the hyperthenar area.  Patient had any fevers, weakness, chills or nausea vomiting    Exam: Gen.: Alert and oriented in no acute distress.  On examination of the hand of the wound looks clean and dry.  I could not express any purulent drainage.  There is no sign of any echo ecchymosis, erythema warmth or streaking.  There is no swelling noted.  Radial pulses intact.  Cap refill is normal.    At this time there is no sign of any serious infection.  We will discuss the case with her orthopedic surgeon.    I performed a history and physical examination of the patient and discussed management with the resident. I reviewed the resident???s note and agree with the documented findings and plan of care. Any areas of disagreement are noted on the chart. I was personally present for the key portions of any procedures. I have documented in the chart those procedures where I was not present during the key portions. I have reviewed the emergency nurses triage note. I agree with the chief complaint, past medical history, past surgical history, allergies, medications, social and family history as documented unless otherwise noted below. Documentation of the HPI, Physical Exam and Medical Decision Making performed by medical students or scribes is based on my personal performance of the HPI, PE and MDM. For Phys Assistant/ Nurse Practitioner cases/documentation I have had a  face to face evaluation of this patient and have completed at least one if not all key elements of the E/M (history, physical exam, and MDM). Additional findings are as noted.      Abelina BachelorAkbar Valyn Latchford DO  Attending Emergency  Physician            Abelina BachelorAkbar Hajra Port, DO  12/18/13 [id number]

## 2013-12-18 NOTE — ED Provider Notes (Signed)
[facility] ED  eMERGENCY dEPARTMENT eNCOUnter  Resident    Pt Name: Linda Gilbert  MRN: [id number]  Birthdate 06-27-1986  Date of evaluation: 12/18/2013  PCP:  Jola Babinski, MD    CHIEF COMPLAINT       Chief Complaint   Patient presents with   ??? Incisional Pain     right hand, surgery 2 weeks ago   ??? Drainage from Incision         HISTORY OF PRESENT ILLNESS    Linda Gilbert is a 28 y.o. female who presents with a complaint of pain and yellow drainage from her surgical incision site on her right wrist/hand. She had carpal tunnel release surgery done on that hand on 12/08/2013. She had a follow up appointment which showed no abnormalities 4 days ago. Today she noted some thick yellow drainage from between her sutures and contacted Dr. Bluford Main office who sent her to the ER. She denies pain away from the surgical site. She denies consitutional symptoms including fever/chills and nausea.       REVIEW OF SYSTEMS       Review of Systems   Constitutional: Negative for fever and chills.   HENT: Negative for congestion and sore throat.    Eyes: Negative for pain and visual disturbance.   Respiratory: Negative for cough and shortness of breath.    Cardiovascular: Negative for chest pain and palpitations.   Gastrointestinal: Negative for nausea, vomiting and abdominal pain.   Genitourinary: Negative for dysuria and hematuria.   Musculoskeletal: Negative for myalgias and back pain.   Skin: Negative for color change and rash.   Allergic/Immunologic: Negative for environmental allergies and food allergies.   Neurological: Negative for weakness, light-headedness and numbness.   Psychiatric/Behavioral: Negative for suicidal ideas and self-injury.       PAST MEDICAL HISTORY    has a past medical history of Kidney stone; Depression; Bipolar 1 disorder (HCC); Migraines; Wears glasses; Dizziness; Numbness and tingling; Nausea; UTI (urinary tract infection); GERD (gastroesophageal reflux disease); Constipation; and Morbid obesity  with BMI of 45.0-49.9, adult (HCC).      SURGICAL HISTORY      has past surgical history that includes knee surgery (Bilateral); Appendectomy; tumor removal (Left); ovarian cyst removal (Bilateral); Tonsillectomy; Tubal ligation (Bilateral); Upper gastrointestinal endoscopy; Cholecystectomy; and Carpal tunnel release (12/08/13).      CURRENT MEDICATIONS       Discharge Medication List as of 12/18/2013 10:04 PM      CONTINUE these medications which have NOT CHANGED    Details   ibuprofen (ADVIL;MOTRIN) 800 MG tablet Take 800 mg by mouth every 8 hours as needed for Pain.      traMADol (ULTRAM) 50 MG tablet Take 1-2 tablets by mouth every 6 hours as needed for Pain., Disp-40 tablet, R-0      promethazine (PHENERGAN) 12.5 MG tablet Take 25 mg by mouth every 8 hours as needed for Nausea.      zolpidem (AMBIEN) 10 MG tablet Take  by mouth nightly as needed for Sleep.      morphine (KADIAN) 30 MG SR capsule Take 15 mg by mouth 2 times daily as needed for Pain.             ALLERGIES     is allergic to dilaudid; tape; and vicodin.    FAMILY HISTORY     has no family status information on file.    family history includes Cancer in her paternal grandfather; Kidney  Disease in her maternal aunt; Liver Cancer in her maternal aunt; Lung Cancer in her maternal grandfather; Thyroid Disease in her mother.      SOCIAL HISTORY      reports that she has been smoking Cigarettes.  She has a 9 pack-year smoking history. She has never used smokeless tobacco. She reports that she does not drink alcohol or use illicit drugs.    PHYSICAL EXAM     INITIAL VITALS:  height is 5\' 3"  (1.6 m) and weight is 250 lb (113.399 kg). Her oral temperature is 98.1 ??F (36.7 ??C). Her blood pressure is 132/72 and her pulse is 71. Her respiration is 17 and oxygen saturation is 98%.      Physical Exam   Constitutional: She is oriented to person, place, and time. She appears well-developed and well-nourished. No distress.   HENT:   Head: Normocephalic and  atraumatic.   Right Ear: External ear normal.   Left Ear: External ear normal.   Eyes: Conjunctivae are normal. Right eye exhibits no discharge. Left eye exhibits no discharge.   Neck: No JVD present. No tracheal deviation present.   Pulmonary/Chest: Effort normal. No respiratory distress.   Abdominal: She exhibits no distension.   Musculoskeletal:   Incision site to right wrist. Clean. Small amount of purulent drainage leaking form between sutures. No surrounding cellulitis.    Neurological: She is alert and oriented to person, place, and time. No cranial nerve deficit.   Skin: Skin is warm. No rash noted. She is not diaphoretic. No erythema.   Psychiatric: She has a normal mood and affect. Her behavior is normal. Judgment and thought content normal.       DIFFERENTIAL DIAGNOSIS/MDM:   Possible surgical site infection, no cellulitis or consitutional symptoms. Will contact Dr. Dion Body who sent patient in for management recommendations.   I spoke with Dr. Dion Body, her orthopedic surgeron, who recommends removal of sutures and starting her on bactrim. She has a follow up appointment with him in three days which she will keep.     DIAGNOSTIC RESULTS     EKG: All EKG's are interpreted by the Emergency Department Physician who either signs or Co-signs this chart in the absence of a cardiologist.        RADIOLOGY:   I directly visualized the following  images and reviewed the radiologist interpretations:           ED BEDSIDE ULTRASOUND:       LABS:  Labs Reviewed - No data to display          EMERGENCY DEPARTMENT COURSE:   Vitals:    Filed Vitals:    12/18/13 2030 12/18/13 2224   BP: 142/79 132/72   Pulse: 65 71   Temp: 98.1 ??F (36.7 ??C)    TempSrc: Oral    Resp: 18 17   Height: 5\' 3"  (1.6 m)    Weight: 250 lb (113.399 kg)    SpO2: 95% 98%         CRITICAL CARE:      CONSULTS:  IP CONSULT TO ORTHOPEDIC SURGERY      PROCEDURES:      FINAL IMPRESSION      1. Superficial incisional surgical site infection, initial encounter             DISPOSITION/PLAN   DISPOSITION Decision to Discharge        PATIENT REFERRED TO:  Virgie Dad, MD  [address], [address]  [place] [address]  [phone]    Call in 2 days        DISCHARGE MEDICATIONS:  Discharge Medication List as of 12/18/2013 10:04 PM      START taking these medications    Details   sulfamethoxazole-trimethoprim (BACTRIM DS) 800-160 MG per tablet Take 1 tablet by mouth 2 times daily for 7 days., Disp-14 tablet, R-0      oxyCODONE-acetaminophen (PERCOCET) 5-325 MG per tablet Take 1 tablet by mouth every 4 hours as needed for Pain., Disp-10 tablet, R-0             (Please note that portions of this note were completed with a voice recognition program.  Efforts were made to edit the dictations but occasionally words are mis-transcribed.)    Wyline BeadyGeorge R Butler, III  Emergency Medicine Resident            III Wyline BeadyGeorge R Butler, MD  Resident  12/19/13 [phone]

## 2013-12-19 ENCOUNTER — Inpatient Hospital Stay
Admit: 2013-12-19 | Discharge: 2013-12-19 | Disposition: A | Discharge status: Home / Self Care | Attending: Emergency Medicine

## 2013-12-19 MED ORDER — OXYCODONE-ACETAMINOPHEN 5-325 MG PO TABS
5-325 MG | Freq: Once | ORAL | Status: AC
Start: 2013-12-19 — End: 2013-12-18
  Administered 2013-12-19: 02:00:00 2 via ORAL

## 2013-12-19 MED ORDER — SULFAMETHOXAZOLE-TMP DS 800-160 MG PO TABS
800-160 MG | Freq: Once | ORAL | Status: AC
Start: 2013-12-19 — End: 2013-12-18
  Administered 2013-12-19: 03:00:00 1 via ORAL

## 2013-12-19 MED ORDER — OXYCODONE-ACETAMINOPHEN 5-325 MG PO TABS
5-325 MG | ORAL_TABLET | ORAL | Status: DC | PRN
Start: 2013-12-19 — End: 2014-02-17

## 2013-12-19 MED ORDER — SULFAMETHOXAZOLE-TRIMETHOPRIM 800-160 MG PO TABS
800-160 MG | ORAL_TABLET | Freq: Two times a day (BID) | ORAL | Status: AC
Start: 2013-12-19 — End: 2013-12-25

## 2013-12-19 MED FILL — SULFAMETHOXAZOLE-TMP DS 800-160 MG PO TABS: 800-160 MG | ORAL | Qty: 1

## 2013-12-19 MED FILL — OXYCODONE-ACETAMINOPHEN 5-325 MG PO TABS: 5-325 MG | ORAL | Qty: 2

## 2013-12-21 NOTE — Progress Notes (Signed)
Subjective:      Patient ID: Linda Gilbert is a 28 y.o. female.    HPI  2 weeks post carpal tunnel release    Patient seen in emergency department wasn't mild erythema sutures discontinued    Patient'sStarted on Bactrim   Review of Systems    Objective:   Physical Exam  Wound healing well    No erythema  Assessment:      Encounter Diagnosis   Name Primary?   ??? Carpal tunnel syndrome Yes            Plan:      Followup 2 weeks

## 2013-12-23 ENCOUNTER — Telehealth

## 2013-12-23 MED ORDER — SULFAMETHOXAZOLE-TRIMETHOPRIM 800-160 MG PO TABS
800-160 MG | ORAL_TABLET | Freq: Two times a day (BID) | ORAL | Status: AC
Start: 2013-12-23 — End: 2014-01-02

## 2013-12-23 NOTE — Telephone Encounter (Signed)
Still burning a lot , yellow stuff coming out and is worried because only has two days left of antibiotics.

## 2013-12-23 NOTE — Telephone Encounter (Signed)
Per Dr.Beeks give a new script for Bactrim   Prescription was called in to Pharmacy and Patient was notified.

## 2014-01-06 NOTE — Progress Notes (Signed)
Subjective:      Patient ID: Linda Gilbert is a 28 y.o. female.    HPI  Patient is here for followup carpal tunnel release    Continues to complain of some peri-incisional tenderness    Numbness and tingling significantly improved  Review of Systems    Objective:   Physical Exam  No signs erythema induration ecchymosis    Some tenderness about the incision  Assessment:      Encounter Diagnosis   Name Primary?   ??? Carpal tunnel syndrome Yes      Doing fairly well post carpal tunnel release      Plan:      Followup 6 weeks

## 2014-02-17 NOTE — Progress Notes (Signed)
Subjective:      Patient ID: Linda Gilbert is a 28 y.o. female.    HPI  followup carpal tunnel release    Still having some Peri- incisional pain but this is improving  Review of Systems    Objective:   Physical Exam  Wound healed well    Numbness continuing significantly improved  Assessment:      Encounter Diagnosis   Name Primary?   ??? Carpal tunnel syndrome Yes     Good outcome following carpal tunnel       Plan:      PRn

## 2014-03-16 ENCOUNTER — Inpatient Hospital Stay
Admit: 2014-03-16 | Discharge: 2014-03-16 | Disposition: A | Discharge status: Home / Self Care | Attending: Emergency Medicine

## 2014-03-16 LAB — URINE DRUG SCREEN
Amphetamine Screen, Ur: NEGATIVE
Barbiturate Screen, Ur: NEGATIVE
Benzodiazepine Screen, Urine: POSITIVE — AB
Cannabinoid Scrn, Ur: NEGATIVE
Cocaine Metabolite, Urine: NEGATIVE
Methadone Screen, Urine: NEGATIVE
Opiates, Urine: NEGATIVE
Oxycodone Screen, Ur: POSITIVE — AB
Phencyclidine, Urine: NEGATIVE

## 2014-03-16 LAB — ACETAMINOPHEN LEVEL: Acetaminophen Level: <10 ug/mL — ABNORMAL LOW (ref 10–30)

## 2014-03-16 LAB — CBC WITH AUTO DIFFERENTIAL
Absolute Eos #: 0.4 10*3/uL (ref 0.0–0.4)
Absolute Lymph #: 2 10*3/uL (ref 1.0–4.8)
Absolute Mono #: 0.6 10*3/uL (ref 0.1–1.3)
Basophils Absolute: 0.1 10*3/uL (ref 0.0–0.2)
Basophils: 0 % (ref 0–2)
Eosinophils %: 3 % (ref 0–4)
Hematocrit: 34.2 % — ABNORMAL LOW (ref 36–46)
Hemoglobin: 11.3 g/dL — ABNORMAL LOW (ref 12.0–16.0)
Lymphocytes: 16 % — ABNORMAL LOW (ref 24–44)
MCH: 30.2 pg (ref 26–34)
MCHC: 33.2 g/dL (ref 31–37)
MCV: 91 fL (ref 80–100)
MPV: 8.7 fL (ref 6.0–12.0)
Monocytes: 5 % (ref 1–7)
Platelets: 233 10*3/uL (ref 150–450)
RBC: 3.76 m/uL — ABNORMAL LOW (ref 4.0–5.2)
RDW: 13.1 % (ref 11.5–14.9)
Seg Neutrophils: 76 % — ABNORMAL HIGH (ref 36–66)
Segs Absolute: 9 10*3/uL (ref 1.3–9.1)
WBC: 12 10*3/uL — ABNORMAL HIGH (ref 3.5–11.0)

## 2014-03-16 LAB — BASIC METABOLIC PANEL
Anion Gap: 14 mmol/L (ref 8–16)
BUN: 8 mg/dL (ref 6–20)
CO2: 26 mmol/L (ref 20–31)
Calcium: 8.8 mg/dL (ref 8.6–10.4)
Chloride: 102 mmol/L (ref 98–107)
Creatinine: 0.49 mg/dL — ABNORMAL LOW (ref 0.50–0.90)
GFR African American: >60 mL/min (ref >60)
GFR Non-African American: >60 mL/min (ref >60)
Glucose: 107 mg/dL — ABNORMAL HIGH (ref 70–99)
Potassium: 4.1 mmol/L (ref 3.7–5.3)
Sodium: 138 mmol/L (ref 135–144)

## 2014-03-16 LAB — ETHANOL
Ethanol percent: 0 %
Ethanol: <10 mg/dL (ref <10)

## 2014-03-16 LAB — DRUG SCREEN TRICYCLIC URINE: Tricyclic Antidep,Urine: NEGATIVE

## 2014-03-16 LAB — HEPATIC FUNCTION PANEL
ALT: 13 U/L (ref 5–33)
AST: 14 U/L (ref <32)
Albumin: 4.2 g/dL (ref 3.5–5.2)
Alkaline Phosphatase: 96 U/L (ref 35–104)
Bilirubin, Direct: <0.08 mg/dL (ref <0.31)
Total Bilirubin: 0.16 mg/dL — ABNORMAL LOW (ref 0.3–1.2)
Total Protein: 7.2 g/dL (ref 6.4–8.3)

## 2014-03-16 LAB — PHOSPHORUS: Phosphorus: 3.3 mg/dL (ref 2.6–4.5)

## 2014-03-16 LAB — HCG, SERUM, QUALITATIVE: hCG Qual: NEGATIVE

## 2014-03-16 LAB — TROP/MYOGLOBIN
Myoglobin: 30 ng/mL (ref 25–58)
Myoglobin: 31 ng/mL (ref 25–58)
Troponin T: <0.03 ng/mL (ref <0.03)
Troponin T: <0.03 ng/mL (ref <0.03)

## 2014-03-16 LAB — SALICYLATE LEVEL: Salicylate Lvl: <1 mg/dL — ABNORMAL LOW (ref 3–10)

## 2014-03-16 LAB — MAGNESIUM: Magnesium: 2 mg/dL (ref 1.6–2.6)

## 2014-03-16 LAB — PROTIME-INR
INR: 1
Protime: 10.3 s (ref 9.7–12.0)

## 2014-03-16 LAB — APTT: PTT: 26.5 s (ref 21.0–35.0)

## 2014-03-16 LAB — LIPASE: Lipase: 24 U/L (ref 13–60)

## 2014-03-16 MED ORDER — NALOXONE HCL 1 MG/ML IJ SOLN
1 MG/ML | Freq: Once | INTRAMUSCULAR | Status: AC
Start: 2014-03-16 — End: 2014-03-16

## 2014-03-16 MED ORDER — NALOXONE HCL 1 MG/ML IJ SOLN
1 MG/ML | INTRAMUSCULAR | Status: AC
Start: 2014-03-16 — End: 2014-03-16
  Administered 2014-03-16: 17:00:00 0.4 via INTRAVENOUS

## 2014-03-16 MED ORDER — SODIUM CHLORIDE 0.9 % IV BOLUS
0.9 % | Freq: Once | INTRAVENOUS | Status: AC
Start: 2014-03-16 — End: 2014-03-16
  Administered 2014-03-16: 15:00:00 1000 mL via INTRAVENOUS

## 2014-03-16 MED ORDER — SODIUM CHLORIDE 0.9 % IV BOLUS
0.9 % | Freq: Once | INTRAVENOUS | Status: AC
Start: 2014-03-16 — End: 2014-03-16
  Administered 2014-03-16: 17:00:00 1000 mL via INTRAVENOUS

## 2014-03-16 MED ORDER — ONDANSETRON HCL 4 MG/2ML IJ SOLN
4 MG/2ML | Freq: Once | INTRAMUSCULAR | Status: AC
Start: 2014-03-16 — End: 2014-03-16
  Administered 2014-03-16: 15:00:00 4 mg via INTRAVENOUS

## 2014-03-16 MED FILL — ONDANSETRON HCL 4 MG/2ML IJ SOLN: 4 MG/2ML | INTRAMUSCULAR | Qty: 2

## 2014-03-16 MED FILL — NALOXONE HCL 1 MG/ML IJ SOLN: 1 mg/mL | INTRAMUSCULAR | Qty: 2

## 2014-03-16 NOTE — ED Provider Notes (Signed)
[facility] ED  EMERGENCY DEPARTMENT ENCOUNTER      Pt Name: Linda Gilbert  MRN: [id number]  Birthdate November 04, 1986  Date of evaluation: 03/16/2014  PCP:  Linda Babinski, MD    CHIEF COMPLAINT:   Chief Complaint   Patient presents with   ??? Nausea     HISTORY OF PRESENT ILLNESS   Linda Gilbert is a 28 y.o. female who presents with recent colonoscopy yesterday but doing well with no increasing abdominal pain who then was at a restaurant today and felt dizzy and lightheaded and then had a syncopal episode.    She is back to normal mental status at this time but states she does have blurry vision in both of her eyes.    Mild headache associated but denies chest pain or shortness of breath.   Does have mild abdominal pain that is unchanged from prior to her colonoscopy.    Denies palpitations.  No calf swelling or pain either.    Denies any unilateral weakness or numbness/tingling in extremities or any numbness or weakness on the face.    Patient does have much nausea associated        REVIEW OF SYSTEMS       Review of Systems      Constitutional: Negative for fever and chills.        Positive syncope   Eyes: Negative for blurred vision.   Respiratory: Negative for shortness of breath and stridor.    Cardiovascular: Negative for chest pain.   Gastrointestinal: Negative for nausea, vomiting, abdominal pain, diarrhea and blood in stool.   Genitourinary: Negative for dysuria.   Musculoskeletal: Negative for myalgias.   Skin: Negative for rash.   Neurological: Negative for focal weakness.   All other systems reviewed and are negative.      10 essential systems negative except as stated above and in the HPI.      PAST MEDICAL HISTORY   PMH:  has a past medical history of Kidney stone; Depression; Bipolar 1 disorder (HCC); Migraines; Wears glasses; Dizziness; Numbness and tingling; Nausea; UTI (urinary tract infection); GERD (gastroesophageal reflux disease); Constipation; and Morbid obesity with BMI of 45.0-49.9, adult  (HCC).  Surgical History:  has past surgical history that includes knee surgery (Bilateral); Appendectomy; tumor removal (Left); ovarian cyst removal (Bilateral); Tonsillectomy; Tubal ligation (Bilateral); Upper gastrointestinal endoscopy; Cholecystectomy; Carpal tunnel release (12/08/13); and other surgical history.  Social History:  reports that she has been smoking Cigarettes.  She has a 9 pack-year smoking history. She has never used smokeless tobacco. She reports that she does not drink alcohol or use illicit drugs.  Family History: Noncontributory at this time  Psychiatric History: Noncontributory at this time    Allergies:is allergic to dilaudid; tape; and vicodin.      PHYSICAL EXAM     INITIAL VITALS: BP 101/71    Pulse 100    Resp 13    Ht 5\' 3"  (1.6 m)    Wt 255 lb (115.667 kg)    BMI 45.18 kg/m2      SpO2 98%    LMP 02/14/2014        Physical Exam   Constitutional: She is oriented to person, place, and time.   HENT:   Head: Normocephalic and atraumatic.   Eyes: EOM are normal. Pupils are equal, round, and reactive to light.   Neck: Normal range of motion. Neck supple. No JVD present.   Cardiovascular: Normal rate, regular rhythm and normal  heart sounds.    Pulmonary/Chest: Effort normal and breath sounds normal. No respiratory distress. She has no wheezes.   Abdominal: Soft. Bowel sounds are normal. She exhibits no distension. There is no tenderness. There is no rebound and no guarding.   Musculoskeletal: She exhibits no edema.   Neurological: She is alert and oriented to person, place, and time. She displays normal reflexes (DTR's are 2+ and equal bilaterally.  Down going Gilbert's bilaterally as well.  ). No cranial nerve deficit (CN II-XII intact. ) or sensory deficit (Sensation to light touch intact bilaterally.  ). She exhibits normal muscle tone (5/5 strength in bilateral proximal and distal flexors and extensors of both upper and lower extremities.  ). Coordination (Finger to nose intact with no  pronator drift.  ) normal.   Skin: Skin is warm and dry. No rash noted.   Vitals reviewed.        EMERGENCY DEPARTMENT COURSE:     LABS: Labs reviewed by myself show:   Labs Reviewed   CBC WITH AUTO DIFFERENTIAL - Abnormal; Notable for the following:     WBC 12.0 (*)     RBC 3.76 (*)     Hemoglobin 11.3 (*)     Hematocrit 34.2 (*)     Seg Neutrophils 76 (*)     Lymphocytes 16 (*)     All other components within normal limits   BASIC METABOLIC PANEL - Abnormal; Notable for the following:     Glucose 107 (*)     CREATININE 0.49 (*)     All other components within normal limits   HEPATIC FUNCTION PANEL - Abnormal; Notable for the following:     Total Bilirubin 0.16 (*)     All other components within normal limits   URINE DRUG SCREEN - Abnormal; Notable for the following:     Benzodiazepine Screen, Urine POSITIVE (*)     Oxycodone Screen, Ur POSITIVE (*)     All other components within normal limits   SALICYLATE LEVEL - Abnormal; Notable for the following:     Salicylate Lvl <1 (*)     All other components within normal limits   ACETAMINOPHEN LEVEL - Abnormal; Notable for the following:     Acetaminophen Level <10 (*)     All other components within normal limits   TROP/MYOGLOBIN   TROP/MYOGLOBIN   PROTIME-INR   APTT   LIPASE   MAGNESIUM   PHOSPHORUS   ETHANOL   DRUG SCREEN TRICYCLIC URINE   HCG, SERUM, QUALITATIVE      Labs are essentially negative when reviewed    EKG: All EKG's are interpreted by the Emergency Department Physician who either signs or Co-signs this chart in the absence of a cardiologist.    EKG Interpretation    Interpreted by emergency department physician    Rhythm: normal sinus   Rate: normal  Axis: normal  Conduction: normal  ST Segments: no acute change  T Waves: no acute change  Q Waves: no acute change    Clinical Impression:  nonspecific EKG.  Unchanged from previous EKG in 2009 when compared        RADIOLOGY:  I directly visualized the following  images and reviewed the radiologist  interpretations:  CT HEAD WO CONTRAST    Final Result:        XR CHEST 1 VW    Final Result:         chest x-ray read as negative by radiology.  CT head read as negative by radiology as no acute intracranial process.        CONSULTS:  None    MDM:    Workup was negative including CT head and chest x-ray.    Patient was still somewhat groggy.    We entertained the possibility of polypharmacy unintentional overdose given recent procedural sedation yesterday and last night for colonoscopy as well as her fentanyl patch and benzodiazepine that she takes as well as Percocet.    0.4 mg of care was given and patient was immediately woke up and essentially is back to normal.    Patient's orthostatics were negative.    Patient has been observed for 6 hours in the emergency department.    She has done well and is now able to easily ambulate throughout the department.    We have encouraged her to either stop the sentinel patch or to stop the oxycodone and she is deciding which.   It is also quite possible she will continue taking both given chronic pain.    Is warned that this may cause harm to her in the future.      FINAL IMPRESSION:     1. Opiate overdose, accidental or unintentional, initial encounter          DISPOSITION:  DISPOSITION Decision to Discharge      PATIENT REFERRED TO:  Linda BabinskiKaleem U Gill, MD  [address]  [id number]  [place] [id number]  [phone]    In 2 days  Return to the ER if worse or any concerns      DISCHARGE MEDICATIONS:  New Prescriptions    No medications on file       (Please note that portions of this note were completed with a voice recognition program.  Efforts were made to edit the dictations but occasionally words are mis-transcribed.)    Salomon MastAaron M Anaijah Augsburger, MD Lacie ScottsFAAEM FACEP  Attending Emergency Medicine Physician           Salomon MastAaron M Camie Hauss, MD  03/16/14 1556

## 2014-03-16 NOTE — ED Notes (Signed)
Pt up ambulated in department, tolerated without difficulty, back to bed safely    Karie Georgesynthia J Nicolasa Milbrath, RN  03/16/14 [phone]

## 2014-03-16 NOTE — ED Notes (Signed)
To ct per stretcher in stable condition     Arminda ResidesLaura T Di Mario, RN  03/16/14 1158

## 2014-03-16 NOTE — ED Notes (Signed)
Drinking water in bed, awaiting disposition     Linda GeorgesCynthia J Eileen Kangas, RN  03/16/14 [phone]

## 2014-03-16 NOTE — ED Notes (Signed)
Pt up to bedside commode at conclusion of orthostatics, no change urine spec sent, pt alert and oreinted    Karie Georgesynthia J Turon Kilmer, RN  03/16/14 1408

## 2014-03-16 NOTE — ED Notes (Signed)
Pts visitor (fiance) found to be video recording this nurse and the patient, it is suspected that this has been going on for some time during the course of ER treatment.  Security officer immediately notified for deletion    Karie GeorgesCynthia J Travaughn Vue, RN  03/16/14 1336

## 2014-03-16 NOTE — ED Notes (Signed)
Discussed with patient ER dr's recommendation about removing the fentanyl patch or not taking percocet as this may be too much for her.  Pt not open to this and states she is returning to pain clinic Dr. Soon and that these medications are the only thing that has helped her in a long time.      Karie Georgesynthia J Shaylynn Nulty, RN  03/16/14 [phone]

## 2014-03-16 NOTE — ED Notes (Signed)
Bed: 07A  Expected date:   Expected time:   Means of arrival:   Comments:

## 2014-03-16 NOTE — ED Notes (Signed)
Pt opens eyes, much more alert after narcan, Dr. Trinda Pascalrqvist notified.    Karie Georgesynthia J Raenette Sakata, RN  03/16/14 1409

## 2014-03-16 NOTE — ED Notes (Signed)
Pt thrashing in bed, appears a response to narcan, will continue to monitor    Karie GeorgesCynthia J Dimple Bastyr, RN  03/16/14 1410

## 2014-03-18 LAB — EKG 12-LEAD
Atrial Rate: 70 {beats}/min
Atrial Rate: 77 {beats}/min
P Axis: 11 degrees
P Axis: 17 degrees
P-R Interval: 194 ms
P-R Interval: 194 ms
Q-T Interval: 388 ms
Q-T Interval: 390 ms
QRS Duration: 90 ms
QRS Duration: 96 ms
QTc Calculation (Bazett): 421 ms
QTc Calculation (Bazett): 439 ms
R Axis: 71 degrees
R Axis: 83 degrees
T Axis: 14 degrees
T Axis: 19 degrees
Ventricular Rate: 70 {beats}/min
Ventricular Rate: 77 {beats}/min

## 2014-08-29 ENCOUNTER — Ambulatory Visit
Admit: 2014-08-29 | Discharge: 2014-08-29 | Payer: PRIVATE HEALTH INSURANCE | Attending: Podiatrist | Primary: Family Medicine

## 2014-08-29 DIAGNOSIS — S93401A Sprain of unspecified ligament of right ankle, initial encounter: Secondary | ICD-10-CM

## 2014-08-29 NOTE — Progress Notes (Signed)
[facility]  [facility]     Linda Gilbert 28 y.o. female that presents for follow-up of   Chief Complaint   Patient presents with   ??? Ankle Pain     Follow up right ankle injection, she is still having pain     She had a severe sprain earlier this year, about 6 months ago. She was treated with a CAM walker, rest, ice. She has also had an injection in the ankle joint right, which helped some. Her main complaint is of the ankle giving out. It is happening more often now and it is very painful. She feels a clicking in the joint when walking. She has fallen because of the ankle giving out. 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     Patient relates pain is Present.  Pain is rated 5 out of 10 and is described as waxing and waning.    Currently denies F/C/N/V.     Allergies   Allergen Reactions   ??? Dilaudid [Hydromorphone]    ??? Tape [place] Organ[Adhesive Tape]    ??? Vicodin [Hydrocodone-Acetaminophen]        Past Medical History   Diagnosis Date   ??? Kidney stone    ??? Depression    ??? Bipolar 1 disorder (HCC)    ??? Migraines    ??? Wears glasses    ??? Dizziness    ??? Numbness and tingling      hands   ??? Nausea      due to migraines and pain related   ??? UTI (urinary tract infection)    ??? GERD (gastroesophageal reflux disease)    ??? Constipation    ??? Morbid obesity with BMI of 45.0-49.9, adult [facility](HCC)        Prior to Admission medications    Medication Sig Start Date End Date Taking? Authorizing Provider   topiramate (TOPAMAX) 100 MG tablet  08/23/14  Yes Historical Provider, MD   oxymorphone (OPANA) 10 MG tablet  08/11/14  Yes Historical Provider, MD   oxyCODONE-acetaminophen (PERCOCET) 5-325 MG per tablet  08/17/14  Yes Historical Provider, MD   fentaNYL (DURAGESIC) 50 MCG/HR Place 1 patch onto the skin every 72 hours   Yes Historical Provider, MD   iloperidone (FANAPT) 1 MG TABS tablet Take 1 mg by mouth 2 times daily   Yes Historical Provider, MD   gabapentin (NEURONTIN) 300 MG capsule   Take 300 mg by mouth 4 times daily     Yes Historical Provider, MD   linaclotide (LINZESS) 145 MCG capsule Take 145 mcg by mouth every morning (before breakfast).   Yes Historical Provider, MD   omeprazole (PRILOSEC) 20 MG capsule   Take 20 mg by mouth 2 times daily    Yes Historical Provider, MD   meloxicam (MOBIC) 15 MG tablet Take 15 mg by mouth daily.   Yes Historical Provider, MD   Glucosamine-Chondroit-Vit C-Mn (GLUCOSAMINE 1500 COMPLEX PO) Take  by mouth.   Yes Historical Provider, MD   clomiPHENE (SEROPHENE) 50 MG tablet Take 50 mg by mouth daily.   Yes Historical Provider, MD   clonazePAM (KLONOPIN) 0.5 MG tablet Take 0.5 mg by mouth 2 times daily as needed for Anxiety.   Yes Historical Provider, MD   promethazine (PHENERGAN) 12.5 MG tablet Take 25 mg by mouth every 8 hours as needed for Nausea.   Yes Historical Provider, MD   zolpidem (AMBIEN) 10 MG tablet Take  by mouth nightly as needed for Sleep.  Yes Historical Provider, MD   morphine (KADIAN) 30 MG SR capsule Take 15 mg by mouth 2 times daily as needed for Pain.   Yes Historical Provider, MD   ibuprofen (ADVIL;MOTRIN) 800 MG tablet Take 800 mg by mouth every 8 hours as needed for Pain. 05/20/13 12/18/13  Salomon MastAaron M Orqvist, MD   ibuprofen (ADVIL;MOTRIN) 800 MG tablet Take 1 tablet by mouth every 8 hours as needed for Pain for 20 doses. 05/12/13 05/19/13  Salomon MastAaron M Orqvist, MD       Review of Systems   Constitutional: Negative for fever, chills, diaphoresis, fatigue and unexpected weight change.   Cardiovascular: Negative for leg swelling.   Gastrointestinal: Negative for nausea, vomiting, diarrhea and constipation.   Musculoskeletal: Positive for joint swelling, arthralgias and gait problem.   Skin: Negative for color change, pallor, rash and wound.   Neurological: Positive for weakness. Negative for numbness.       Lower Extremity Physical Examination:     Vitals: There were no vitals filed for this visit.  General: AAO x 3 in NAD.     Integument:   Warm, dry, supple, Bilateral.  Open lesion  absent, Bilateral.  Interdigital maceration absent to web spaces 4, Bilateral.     Vascular: DP and PT pulses palpable 2/4, Bilateral.  CFT <3 seconds, Bilateral.  Hair growth present to the level of the digits, Bilateral.  Edema present, Right lateral ankle.      Neurological: Sensation intact to light touch to level of digits, Bilateral.     Musculoskeletal: Muscle strength 4/5, Right, with slight weakness in plantar flexion and eversion.  Pain present upon palpation of anterior ankle margin, lateral collateral ligaments , the ATFL and CFL, Right. Patient is unable to do a single leg stance for longer than 3 seconds.     Asessment: Patient is a 28 y.o. female with:   1. Severe ankle sprain, right, initial encounter    2. Ankle instability, right    3. Pain in lateral portion of right ankle    4. Rupture of ligament of ankle, right, initial encounter        Plan: Patient examined and evaluated.  Current condition and treatment options discussed in detail.   Advised pt to wear Aircast brace.  Verbal and written instructions given to patient. Contact office with any questions/problems/concerns. Ordered MRI right ankle. I am concerned for a ligament rupture and possible talar dome lesion. We briefly discussed surgical intervention based on MRI findings.    Orders Placed This Encounter   Procedures   ??? MRI Ankle Right WO Contrast     Standing Status: Future      Number of Occurrences:       Standing Expiration Date: 08/30/2015   ??? PR ANKLE CONTROL ORTHO PRE OTS     No orders of the defined types were placed in this encounter.        RTC in after MRI.

## 2014-09-15 ENCOUNTER — Encounter (HOSPITAL_BASED_OUTPATIENT_CLINIC_OR_DEPARTMENT_OTHER): Payer: Self-pay | Admitting: *Deleted

## 2014-09-15 ENCOUNTER — Emergency Department (HOSPITAL_BASED_OUTPATIENT_CLINIC_OR_DEPARTMENT_OTHER)
Admission: EM | Admit: 2014-09-15 | Discharge: 2014-09-15 | Disposition: A | Discharge status: Home / Self Care | Payer: 59 | Attending: Emergency Medicine | Admitting: Emergency Medicine

## 2014-09-15 DIAGNOSIS — Z3202 Encounter for pregnancy test, result negative: Secondary | ICD-10-CM | POA: Insufficient documentation

## 2014-09-15 DIAGNOSIS — Z720 Tobacco use: Secondary | ICD-10-CM | POA: Insufficient documentation

## 2014-09-15 DIAGNOSIS — N390 Urinary tract infection, site not specified: Secondary | ICD-10-CM

## 2014-09-15 DIAGNOSIS — R300 Dysuria: Secondary | ICD-10-CM | POA: Diagnosis present

## 2014-09-15 LAB — URINALYSIS, ROUTINE W REFLEX MICROSCOPIC
Bilirubin Urine: NEGATIVE
GLUCOSE, UA: NEGATIVE mg/dL
HGB URINE DIPSTICK: NEGATIVE
KETONES UR: NEGATIVE mg/dL
Nitrite: POSITIVE — AB
Protein, ur: NEGATIVE mg/dL
Specific Gravity, Urine: 1.018 (ref 1.005–1.030)
UROBILINOGEN UA: 0.2 mg/dL (ref 0.0–1.0)
pH: 6.5 (ref 5.0–8.0)

## 2014-09-15 LAB — URINE MICROSCOPIC-ADD ON

## 2014-09-15 LAB — PREGNANCY, URINE: PREG TEST UR: NEGATIVE

## 2014-09-15 MED ORDER — NAPROXEN 500 MG PO TABS: 500.0000 mg | ORAL_TABLET | Freq: Two times a day (BID) | ORAL | Status: DC

## 2014-09-15 MED ORDER — CEPHALEXIN 500 MG PO CAPS: 500.0000 mg | ORAL_CAPSULE | Freq: Four times a day (QID) | ORAL | Status: DC

## 2014-09-15 NOTE — Discharge Instructions (Signed)

## 2014-09-15 NOTE — ED Provider Notes (Signed)
CSN: [id number]     Arrival date & time 09/15/14  1605 History   First MD Initiated Contact with Patient 09/15/14 1839     This chart was scribed for Crystal DibblesJon Makenley Shimp, MD by Arlan OrganAshley Leger, ED Scribe. This patient was seen in room MH12/MH12 and the patient's care was started 6:42 PM.   Chief Complaint  Patient presents with  . Dysuria   Patient is a 28 y.o. female presenting with dysuria and back pain. The history is provided by the patient. No language interpreter was used.  Dysuria Pain quality:  Aching Pain severity:  Mild Onset quality:  Gradual Duration:  2 weeks Timing:  Constant Progression:  Unchanged Chronicity:  New Relieved by:  None tried Worsened by:  Nothing tried Ineffective treatments:  None tried Associated symptoms: no fever   Back Pain Quality:  Aching Radiates to:  Does not radiate Pain severity:  Mild Pain is:  Same all the time Duration:  2 weeks Timing:  Constant Progression:  Unchanged Chronicity:  New Relieved by:  None tried Worsened by:  Nothing tried Ineffective treatments:  None tried Associated symptoms: dysuria   Associated symptoms: no fever     HPI Comments: Crystal Dennis is a [age] y.o. female who presents to the Emergency Department complaining of constant, mild L lower back pain x 2 weeks. She describes pain as achy. She also reports urinary frequency and mild dysuria when her bladder is full. She has not tried any OTC medications or home remedies for above symptoms. Pt has not been evaluated previously for urinary issues. No recent vaginal discharge. Ms. [name] denies any fever or chills. No known allergies to medications. No other concerns visit.   History reviewed. No pertinent past medical history. Past Surgical History  Procedure Laterality Date  . Abdominal hysterectomy    . Cesarean section    . Appendectomy     No family history on file. History  Substance Use Topics  . Smoking status: Current Every Day Smoker -- 0.50 packs/day     Types: Cigarettes  . Smokeless tobacco: Not on file  . Alcohol Use: No   OB History    No data available     Review of Systems  Constitutional: Negative for fever and chills.  Genitourinary: Positive for dysuria, urgency and frequency.  Musculoskeletal: Positive for back pain.  All other systems reviewed and are negative.     Allergies  Review of patient's allergies indicates no known allergies.  Home Medications   Prior to Admission medications   Not on File   Triage Vitals: BP 122/75 mmHg  Pulse 80  Temp(Src) 98.8 F (37.1 C)  Resp 16  Ht 5\' 9"  (1.753 m)  Wt 140 lb (63.504 kg)  BMI 20.67 kg/m2   Physical Exam  Constitutional: She appears well-developed and well-nourished. No distress.  HENT:  Head: Normocephalic and atraumatic.  Right Ear: External ear normal.  Left Ear: External ear normal.  Eyes: Conjunctivae are normal. Right eye exhibits no discharge. Left eye exhibits no discharge. No scleral icterus.  Neck: Neck supple. No tracheal deviation present.  Cardiovascular: Normal rate, regular rhythm and intact distal pulses.   Pulmonary/Chest: Effort normal and breath sounds normal. No stridor. No respiratory distress. She has no wheezes. She has no rales.  Abdominal: Soft. Bowel sounds are normal. She exhibits no distension. There is tenderness. There is no rebound and no guarding.  Suprapubic tenderness noted  Genitourinary:  No CVA tenderness  Musculoskeletal: She exhibits no edema  or tenderness.  Neurological: She is alert. She has normal strength. No cranial nerve deficit (no facial droop, extraocular movements intact, no slurred speech) or sensory deficit. She exhibits normal muscle tone. She displays no seizure activity. Coordination normal.  Skin: Skin is warm and dry. No rash noted.  Psychiatric: She has a normal mood and affect.  Nursing note and vitals reviewed.   ED Course  Procedures (including critical care time)  DIAGNOSTIC  STUDIES:   COORDINATION OF CARE: 6:45 PM-Discussed treatment plan with pt at bedside and pt agreed to plan.     Labs Review Labs Reviewed  URINALYSIS, ROUTINE W REFLEX MICROSCOPIC - Abnormal; Notable for the following:    APPearance CLOUDY (*)    Nitrite POSITIVE (*)    Leukocytes, UA SMALL (*)    All other components within normal limits  URINE MICROSCOPIC-ADD ON - Abnormal; Notable for the following:    Squamous Epithelial / LPF FEW (*)    Bacteria, UA MANY (*)    All other components within normal limits  PREGNANCY, URINE     MDM   Final diagnoses:  UTI (lower urinary tract infection)    Believe her symptoms are related to a urinary tract infection.  Patient is afebrile. I doubt pyelonephritis.  Discharge home with a prescription for Keflex and Naprosyn  I personally performed the services described in this documentation, which was scribed in my presence. The recorded information has been reviewed and is accurate.    Crystal DibblesJon Ludmila Ebarb, MD 09/15/14 [phone]

## 2014-09-15 NOTE — ED Notes (Signed)
Pt c/o painful freq urination  X 2 weeks

## 2014-09-18 LAB — URINE CULTURE: Colony Count: >=100000

## 2014-09-20 ENCOUNTER — Telehealth (HOSPITAL_COMMUNITY): Payer: Self-pay

## 2014-09-20 NOTE — Telephone Encounter (Signed)
Post ED Visit - Positive Culture Follow-up  Culture report reviewed by antimicrobial stewardship pharmacist: []  Crystal Dennis, Pharm.D., BCPS []  [name], Pharm.D., BCPS []  [name], Pharm.D., BCPS []  [name], [place]Pharm[place]D., BCPS, AAHIVP []  [name], Pharm.D., BCPS, AAHIVP []  [name], [address] BoulevardPharm.D[address] X  MDS  Positive Urine culture, >/= 100,000 colonies -> E Coli Treated with Cephalexin, organism sensitive to the same and no further patient follow-up is required at this time.  [name], [name] 09/20/2014, 2:56 AM

## 2014-09-28 ENCOUNTER — Encounter (HOSPITAL_BASED_OUTPATIENT_CLINIC_OR_DEPARTMENT_OTHER): Payer: Self-pay

## 2014-09-28 ENCOUNTER — Emergency Department (HOSPITAL_BASED_OUTPATIENT_CLINIC_OR_DEPARTMENT_OTHER)
Admission: EM | Admit: 2014-09-28 | Discharge: 2014-09-28 | Disposition: A | Discharge status: Home / Self Care | Payer: No Typology Code available for payment source | Attending: Emergency Medicine | Admitting: Emergency Medicine

## 2014-09-28 ENCOUNTER — Emergency Department (HOSPITAL_BASED_OUTPATIENT_CLINIC_OR_DEPARTMENT_OTHER): Payer: No Typology Code available for payment source

## 2014-09-28 DIAGNOSIS — Y92410 Unspecified street and highway as the place of occurrence of the external cause: Secondary | ICD-10-CM | POA: Insufficient documentation

## 2014-09-28 DIAGNOSIS — S39012A Strain of muscle, fascia and tendon of lower back, initial encounter: Secondary | ICD-10-CM | POA: Insufficient documentation

## 2014-09-28 DIAGNOSIS — Y998 Other external cause status: Secondary | ICD-10-CM | POA: Insufficient documentation

## 2014-09-28 DIAGNOSIS — S8001XA Contusion of right knee, initial encounter: Secondary | ICD-10-CM

## 2014-09-28 DIAGNOSIS — Z720 Tobacco use: Secondary | ICD-10-CM | POA: Insufficient documentation

## 2014-09-28 DIAGNOSIS — Y9389 Activity, other specified: Secondary | ICD-10-CM | POA: Insufficient documentation

## 2014-09-28 DIAGNOSIS — S3992XA Unspecified injury of lower back, initial encounter: Secondary | ICD-10-CM | POA: Diagnosis present

## 2014-09-28 MED ORDER — TRAMADOL HCL 50 MG PO TABS: 50.0000 mg | ORAL_TABLET | Freq: Four times a day (QID) | ORAL | Status: AC | PRN

## 2014-09-28 MED ORDER — CYCLOBENZAPRINE HCL 10 MG PO TABS: 10.0000 mg | ORAL_TABLET | Freq: Three times a day (TID) | ORAL | Status: AC | PRN

## 2014-09-28 MED ORDER — IBUPROFEN 800 MG PO TABS: 800.0000 mg | ORAL_TABLET | Freq: Three times a day (TID) | ORAL | Status: AC
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
## 2014-09-28 NOTE — Discharge Instructions (Signed)
Contusion °A contusion is a deep bruise. Contusions are the result of an injury that caused bleeding under the skin. The contusion may turn blue, purple, or yellow. Minor injuries will give you a painless contusion, but more severe contusions may stay painful and swollen for a few weeks.  °CAUSES  °A contusion is usually caused by a blow, trauma, or direct force to an area of the body. °SYMPTOMS  °· Swelling and redness of the injured area. °· Bruising of the injured area. °· Tenderness and soreness of the injured area. °· Pain. °DIAGNOSIS  °The diagnosis can be made by taking a history and physical exam. An X-ray, CT scan, or MRI may be needed to determine if there were any associated injuries, such as fractures. °TREATMENT  °Specific treatment will depend on what area of the body was injured. In general, the best treatment for a contusion is resting, icing, elevating, and applying cold compresses to the injured area. Over-the-counter medicines may also be recommended for pain control. Ask your caregiver what the best treatment is for your contusion. °HOME CARE INSTRUCTIONS  °· Put ice on the injured area. °· Put ice in a plastic bag. °· Place a towel between your skin and the bag. °· Leave the ice on for 15-20 minutes, 3-4 times a day, or as directed by your health care provider. °· Only take over-the-counter or prescription medicines for pain, discomfort, or fever as directed by your caregiver. Your caregiver may recommend avoiding anti-inflammatory medicines (aspirin, ibuprofen, and naproxen) for 48 hours because these medicines may increase bruising. °· Rest the injured area. °· If possible, elevate the injured area to reduce swelling. °SEEK IMMEDIATE MEDICAL CARE IF:  °· You have increased bruising or swelling. °· You have pain that is getting worse. °· Your swelling or pain is not relieved with medicines. °MAKE SURE YOU:  °· Understand these instructions. °· Will watch your condition. °· Will get help right  away if you are not doing well or get worse. °Document Released: 07/31/2005 Document Revised: 10/26/2013 Document Reviewed: 08/26/2011 °ExitCare® Patient Information ©2015 [employer]. This information is not intended to replace advice given to you by your health care provider. Make sure you discuss any questions you have with your health care provider. ° °Blunt Trauma °You have been evaluated for injuries. You have been examined and your caregiver has not found injuries serious enough to require hospitalization. °It is common to have multiple bruises and sore muscles following an accident. These tend to feel worse for the first 24 hours. You will feel more stiffness and soreness over the next several hours and worse when you wake up the first morning after your accident. After this point, you should begin to improve with each passing day. The amount of improvement depends on the amount of damage done in the accident. °Following your accident, if some part of your body does not work as it should, or if the pain in any area continues to increase, you should return to the Emergency Department for re-evaluation.  °HOME CARE INSTRUCTIONS  °Routine care for sore areas should include: °· Ice to sore areas every 2 hours for 20 minutes while awake for the next 2 days. °· Drink extra fluids (not alcohol). °· Take a hot or warm shower or bath once or twice a day to increase blood flow to sore muscles. This will help you "limber up". °· Activity as tolerated. Lifting may aggravate neck or back pain. °· Only take over-the-counter or prescription   medicines for pain, discomfort, or fever as directed by your caregiver. Do not use aspirin. This may increase bruising or increase bleeding if there are small areas where this is happening. SEEK IMMEDIATE MEDICAL CARE IF:  Numbness, tingling, weakness, or problem with the use of your arms or legs.  A severe headache is not relieved with medications.  There is a change in bowel  or bladder control.  Increasing pain in any areas of the body.  Short of breath or dizzy.  Nauseated, vomiting, or sweating.  Increasing belly (abdominal) discomfort.  Blood in urine, stool, or vomiting blood.  Pain in either shoulder in an area where a shoulder strap would be.  Feelings of lightheadedness or if you have a fainting episode. Sometimes it is not possible to identify all injuries immediately after the trauma. It is important that you continue to monitor your condition after the emergency department visit. If you feel you are not improving, or improving more slowly than should be expected, call your physician. If you feel your symptoms (problems) are worsening, return to the Emergency Department immediately. Document Released: 07/17/2001 Document Revised: 01/13/2012 Document Reviewed: 06/08/2008 [facility] Patient Information 2015 [place]Care, Maryland[place]. This information is not intended to replace advice given to you by your health care provider. Make sure you discuss any questions you have with your health care provider.  Lumbosacral Strain Lumbosacral strain is a strain of any of the parts that make up your lumbosacral vertebrae. Your lumbosacral vertebrae are the bones that make up the lower third of your backbone. Your lumbosacral vertebrae are held together by muscles and tough, fibrous tissue (ligaments).  CAUSES  A sudden blow to your back can cause lumbosacral strain. Also, anything that causes an excessive stretch of the muscles in the low back can cause this strain. This is typically seen when people exert themselves strenuously, fall, lift heavy objects, bend, or crouch repeatedly. RISK FACTORS  Physically demanding work.  Participation in pushing or pulling sports or sports that require a sudden twist of the back (tennis, golf, baseball).  Weight lifting.  Excessive lower back curvature.  Forward-tilted pelvis.  Weak back or abdominal muscles or both.  Tight  hamstrings. SIGNS AND SYMPTOMS  Lumbosacral strain may cause pain in the area of your injury or pain that moves (radiates) down your leg.  DIAGNOSIS Your health care provider can often diagnose lumbosacral strain through a physical exam. In some cases, you may need tests such as X-ray exams.  TREATMENT  Treatment for your lower back injury depends on many factors that your clinician will have to evaluate. However, most treatment will include the use of anti-inflammatory medicines. HOME CARE INSTRUCTIONS   Avoid hard physical activities (tennis, racquetball, waterskiing) if you are not in proper physical condition for it. This may aggravate or create problems.  If you have a back problem, avoid sports requiring sudden body movements. Swimming and walking are generally safer activities.  Maintain good posture.  Maintain a healthy weight.  For acute conditions, you may put ice on the injured area.  Put ice in a plastic bag.  Place a towel between your skin and the bag.  Leave the ice on for 20 minutes, 2-3 times a day.  When the low back starts healing, stretching and strengthening exercises may be recommended. SEEK MEDICAL CARE IF:  Your back pain is getting worse.  You experience severe back pain not relieved with medicines. SEEK IMMEDIATE MEDICAL CARE IF:   You have numbness, tingling, weakness,  or problems with the use of your arms or legs. °· There is a change in bowel or bladder control. °· You have increasing pain in any area of the body, including your belly (abdomen). °· You notice shortness of breath, dizziness, or feel faint. °· You feel sick to your stomach (nauseous), are throwing up (vomiting), or become sweaty. °· You notice discoloration of your toes or legs, or your feet get very cold. °MAKE SURE YOU:  °· Understand these instructions. °· Will watch your condition. °· Will get help right away if you are not doing well or get worse. °Document Released: 07/31/2005  Document Revised: 10/26/2013 Document Reviewed: 06/09/2013 °ExitCare® Patient Information ©2015 [employer]. This information is not intended to replace advice given to you by your health care provider. Make sure you discuss any questions you have with your health care provider. ° °
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
## 2014-10-10 ENCOUNTER — Emergency Department (HOSPITAL_BASED_OUTPATIENT_CLINIC_OR_DEPARTMENT_OTHER)
Admission: EM | Admit: 2014-10-10 | Discharge: 2014-10-10 | Disposition: A | Discharge status: Home / Self Care | Payer: Worker's Compensation | Attending: Emergency Medicine | Admitting: Emergency Medicine

## 2014-10-10 ENCOUNTER — Encounter (HOSPITAL_BASED_OUTPATIENT_CLINIC_OR_DEPARTMENT_OTHER): Payer: Self-pay | Admitting: Emergency Medicine

## 2014-10-10 ENCOUNTER — Emergency Department (HOSPITAL_BASED_OUTPATIENT_CLINIC_OR_DEPARTMENT_OTHER): Payer: Worker's Compensation

## 2014-10-10 DIAGNOSIS — Z79899 Other long term (current) drug therapy: Secondary | ICD-10-CM | POA: Insufficient documentation

## 2014-10-10 DIAGNOSIS — Y990 Civilian activity done for income or pay: Secondary | ICD-10-CM | POA: Diagnosis not present

## 2014-10-10 DIAGNOSIS — Y9389 Activity, other specified: Secondary | ICD-10-CM | POA: Insufficient documentation

## 2014-10-10 DIAGNOSIS — Z720 Tobacco use: Secondary | ICD-10-CM | POA: Diagnosis not present

## 2014-10-10 DIAGNOSIS — Y9289 Other specified places as the place of occurrence of the external cause: Secondary | ICD-10-CM | POA: Insufficient documentation

## 2014-10-10 DIAGNOSIS — W1839XA Other fall on same level, initial encounter: Secondary | ICD-10-CM | POA: Insufficient documentation

## 2014-10-10 DIAGNOSIS — S0990XA Unspecified injury of head, initial encounter: Secondary | ICD-10-CM | POA: Diagnosis present

## 2014-10-10 DIAGNOSIS — S60222A Contusion of left hand, initial encounter: Secondary | ICD-10-CM | POA: Insufficient documentation

## 2014-10-10 DIAGNOSIS — W19XXXA Unspecified fall, initial encounter: Secondary | ICD-10-CM

## 2014-10-10 NOTE — Discharge Instructions (Signed)
Ice for 20 minutes every 2 hours while awake for the next 2 days.  Ibuprofen 600 mg every 6 hours as needed for pain.  Follow-up with your primary Dr. if not improving in the next week.   Contusion A contusion is a deep bruise. Contusions are the result of an injury that caused bleeding under the skin. The contusion may turn blue, purple, or yellow. Minor injuries will give you a painless contusion, but more severe contusions may stay painful and swollen for a few weeks.  CAUSES  A contusion is usually caused by a blow, trauma, or direct force to an area of the body. SYMPTOMS   Swelling and redness of the injured area.  Bruising of the injured area.  Tenderness and soreness of the injured area.  Pain. DIAGNOSIS  The diagnosis can be made by taking a history and physical exam. An X-ray, CT scan, or MRI may be needed to determine if there were any associated injuries, such as fractures. TREATMENT  Specific treatment will depend on what area of the body was injured. In general, the best treatment for a contusion is resting, icing, elevating, and applying cold compresses to the injured area. Over-the-counter medicines may also be recommended for pain control. Ask your caregiver what the best treatment is for your contusion. HOME CARE INSTRUCTIONS   Put ice on the injured area.  Put ice in a plastic bag.  Place a towel between your skin and the bag.  Leave the ice on for 15-20 minutes, 3-4 times a day, or as directed by your health care provider.  Only take over-the-counter or prescription medicines for pain, discomfort, or fever as directed by your caregiver. Your caregiver may recommend avoiding anti-inflammatory medicines (aspirin, ibuprofen, and naproxen) for 48 hours because these medicines may increase bruising.  Rest the injured area.  If possible, elevate the injured area to reduce swelling. SEEK IMMEDIATE MEDICAL CARE IF:   You have increased bruising or swelling.  You  have pain that is getting worse.  Your swelling or pain is not relieved with medicines. MAKE SURE YOU:   Understand these instructions.  Will watch your condition.  Will get help right away if you are not doing well or get worse. Document Released: 07/31/2005 Document Revised: 10/26/2013 Document Reviewed: 08/26/2011 [facility] Patient Information 2015 [place]ExitCare, MarylandLLC. This information is not intended to replace advice given to you by your health care provider. Make sure you discuss any questions you have with your health care provider.

## 2014-10-10 NOTE — ED Provider Notes (Signed)
CSN: [id number]     Arrival date & time 10/10/14  1346 History   First MD Initiated Contact with Patient 10/10/14 1359     Chief Complaint  Patient presents with  . Hand Injury     (Consider location/radiation/quality/duration/timing/severity/associated sxs/prior Treatment) HPI Comments: Patient is a 28 year old female who presents with complaints of left hand pain. She fell at work after tripping over some boxes.  Patient is a 28 y.o. female presenting with hand injury. The history is provided by the patient.  Hand Injury Location:  Hand Time since incident:  1 hour Injury: yes   Mechanism of injury: fall   Hand location:  L hand Pain details:    Quality:  Sharp   Radiates to:  Does not radiate   Severity:  Moderate   Onset quality:  Sudden   Timing:  Constant   Progression:  Unchanged Chronicity:  New Dislocation: no     History reviewed. No pertinent past medical history. Past Surgical History  Procedure Laterality Date  . Abdominal hysterectomy    . Cesarean section    . Appendectomy     History reviewed. No pertinent family history. History  Substance Use Topics  . Smoking status: Current Every Day Smoker -- 0.50 packs/day    Types: Cigarettes  . Smokeless tobacco: Not on file  . Alcohol Use: Yes   OB History    No data available     Review of Systems  All other systems reviewed and are negative.     Allergies  Review of patient's allergies indicates no known allergies.  Home Medications   Prior to Admission medications   Medication Sig Start Date End Date Taking? Authorizing Provider  cyclobenzaprine (FLEXERIL) 10 MG tablet Take 1 tablet (10 mg total) by mouth 3 (three) times daily as needed for muscle spasms. 09/28/14   Gilda Creasehristopher J. Pollina, MD  ibuprofen (ADVIL,MOTRIN) 800 MG tablet Take 1 tablet (800 mg total) by mouth 3 (three) times daily. 09/28/14   Gilda Creasehristopher J. Pollina, MD  traMADol (ULTRAM) 50 MG tablet Take 1 tablet (50 mg total) by  mouth every 6 (six) hours as needed. 09/28/14   Gilda Creasehristopher J. Pollina, MD   BP 110/71 mmHg  Pulse 73  Temp(Src) 98.2 F (36.8 C) (Oral)  Resp 18  Ht 5\' 9"  (1.753 m)  Wt 145 lb (65.772 kg)  BMI 21.40 kg/m2  SpO2 100% Physical Exam  Constitutional: She is oriented to person, place, and time. She appears well-developed and well-nourished. No distress.  HENT:  Head: Normocephalic and atraumatic.  Neck: Normal range of motion. Neck supple.  Musculoskeletal:  The left hand appears grossly normal. There is tenderness to palpation in the area of the thenar eminence. She is able to flex and extend the thumb. Sensation is intact to the distal fingers. Capillary refill is brisk to all fingers.  Neurological: She is alert and oriented to person, place, and time.  Skin: Skin is warm and dry. She is not diaphoretic.  Nursing note and vitals reviewed.   ED Course  Procedures (including critical care time) Labs Review Labs Reviewed - No data to display  Imaging Review No results found.   EKG Interpretation None      MDM   Final diagnoses:  Fall    X-rays are negative for fracture or dislocation. This appears to be a sprain/contusion. I will advise ice and ibuprofen and when necessary follow-up.    Geoffery Lyonsouglas Shawan Corella, MD 10/10/14 [phone]

## 2014-10-10 NOTE — ED Notes (Signed)
Pt states she fell at work today hurting her left hand

## 2015-06-12 ENCOUNTER — Encounter: Admit: 2015-06-13 | Discharge: 2015-07-28 | Payer: PRIVATE HEALTH INSURANCE | Primary: Family Medicine

## 2015-06-12 ENCOUNTER — Inpatient Hospital Stay
Admit: 2015-06-12 | Discharge: 2015-06-13 | Disposition: A | Discharge status: Home / Self Care | Attending: Emergency Medicine

## 2015-06-12 DIAGNOSIS — R1013 Epigastric pain: Secondary | ICD-10-CM

## 2015-06-12 LAB — CBC WITH AUTO DIFFERENTIAL
Absolute Eos #: 0.3 10*3/uL (ref 0.0–0.4)
Absolute Lymph #: 2.9 10*3/uL (ref 1.0–4.8)
Absolute Mono #: 0.6 10*3/uL (ref 0.1–1.3)
Basophils Absolute: 0.1 10*3/uL (ref 0.0–0.2)
Basophils: 1 % (ref 0–2)
Eosinophils %: 4 % (ref 0–4)
Hematocrit: 39.1 % (ref 36–46)
Hemoglobin: 13.1 g/dL (ref 12.0–16.0)
Lymphocytes: 32 % (ref 24–44)
MCH: 28 pg (ref 26–34)
MCHC: 33.4 g/dL (ref 31–37)
MCV: 83.8 fL (ref 80–100)
MPV: 8.7 fL (ref 6.0–12.0)
Monocytes: 7 % (ref 1–7)
Platelets: 253 10*3/uL (ref 150–450)
RBC: 4.67 m/uL (ref 4.0–5.2)
RDW: 13.8 % (ref 11.5–14.9)
Seg Neutrophils: 56 % (ref 36–66)
Segs Absolute: 5.3 10*3/uL (ref 1.3–9.1)
WBC: 9.3 10*3/uL (ref 3.5–11.0)

## 2015-06-12 LAB — URINALYSIS
Bilirubin Urine: NEGATIVE
Glucose, Ur: NEGATIVE
Ketones, Urine: NEGATIVE
Leukocyte Esterase, Urine: NEGATIVE
Nitrite, Urine: NEGATIVE
Protein, UA: NEGATIVE
Specific Gravity, UA: 1.016 (ref 1.000–1.030)
Urine Hgb: NEGATIVE
Urobilinogen, Urine: ELEVATED — AB
pH, UA: 7.5 (ref 5.0–8.0)

## 2015-06-12 LAB — COMPREHENSIVE METABOLIC PANEL
ALT: 40 U/L — ABNORMAL HIGH (ref 5–33)
AST: 44 U/L — ABNORMAL HIGH (ref <32)
Albumin: 4.6 g/dL (ref 3.5–5.2)
Alkaline Phosphatase: 98 U/L (ref 35–104)
Anion Gap: 17 mmol/L (ref 9–17)
BUN: 10 mg/dL (ref 6–20)
CO2: 19 mmol/L — ABNORMAL LOW (ref 20–31)
Calcium: 9.6 mg/dL (ref 8.6–10.4)
Chloride: 104 mmol/L (ref 98–107)
Creatinine: 0.6 mg/dL (ref 0.50–0.90)
GFR African American: >60 mL/min (ref >60)
GFR Non-African American: >60 mL/min (ref >60)
Glucose: 91 mg/dL (ref 70–99)
Potassium: 3.8 mmol/L (ref 3.7–5.3)
Sodium: 140 mmol/L (ref 135–144)
Total Bilirubin: 0.5 mg/dL (ref 0.3–1.2)
Total Protein: 7.9 g/dL (ref 6.4–8.3)

## 2015-06-12 LAB — MICROSCOPIC URINALYSIS
Epithelial Cells UA: 5 /HPF
RBC, UA: 0 /HPF
WBC, UA: 0 /HPF

## 2015-06-12 LAB — LIPASE: Lipase: 107 U/L — ABNORMAL HIGH (ref 13–60)

## 2015-06-12 LAB — POCT HCG, PREGNANCY, UR: HCG, Pregnancy Urine (POC): NEGATIVE

## 2015-06-12 MED ORDER — ONDANSETRON HCL 4 MG/2ML IJ SOLN
4 MG/2ML | Freq: Once | INTRAMUSCULAR | Status: AC
Start: 2015-06-12 — End: 2015-06-12
  Administered 2015-06-12: 23:00:00 4 mg via INTRAVENOUS

## 2015-06-12 MED ORDER — FENTANYL CITRATE (PF) 100 MCG/2ML IJ SOLN
100 MCG/2ML | Freq: Once | INTRAMUSCULAR | Status: AC
Start: 2015-06-12 — End: 2015-06-12
  Administered 2015-06-12: 23:00:00 50 ug via INTRAVENOUS

## 2015-06-12 MED ORDER — SODIUM CHLORIDE 0.9 % IV BOLUS
0.9 % | Freq: Once | INTRAVENOUS | Status: AC
Start: 2015-06-12 — End: 2015-06-12
  Administered 2015-06-12: 23:00:00 500 mL via INTRAVENOUS

## 2015-06-12 MED ORDER — PROMETHAZINE HCL 25 MG/ML IJ SOLN
25 MG/ML | Freq: Once | INTRAMUSCULAR | Status: AC
Start: 2015-06-12 — End: 2015-06-12
  Administered 2015-06-12: 12.5 mg via INTRAVENOUS

## 2015-06-12 MED FILL — FENTANYL CITRATE (PF) 100 MCG/2ML IJ SOLN: 100 MCG/2ML | INTRAMUSCULAR | Qty: 2

## 2015-06-12 MED FILL — PROMETHAZINE HCL 25 MG/ML IJ SOLN: 25 MG/ML | INTRAMUSCULAR | Qty: 1

## 2015-06-12 MED FILL — ONDANSETRON HCL 4 MG/2ML IJ SOLN: 4 MG/2ML | INTRAMUSCULAR | Qty: 2

## 2015-06-12 NOTE — ED Notes (Signed)
Pt up to restroom with assist.      Margarette CanadaSarah Armen Waring, RN  06/12/15 2120

## 2015-06-12 NOTE — ED Notes (Signed)
Pt back to bed with assist.      Margarette CanadaSarah Brenee Gajda, RN  06/12/15 2120

## 2015-06-12 NOTE — ED Provider Notes (Signed)
ADDENDUM:        Care of this patient was assumed from Dr. Reed Pandy.  The patient was seen for Abdominal Pain - x 1 week and Emesis  .  The patient's initial evaluation and plan have been discussed with the prior provider who initially evaluated the patient.  Nursing Notes, Past Medical Hx, Past Surgical Hx, Social Hx, Allergies, and Family Hx were all reviewed.    Patient is being abdominal pain and vomiting for last couple days.  Patient states that it's intermittent but is becoming more constant.  Patient has received multiple medications at this time.  Patient does have chronic pain issues.    Plan: await CT results and the rest lab work.    ED Course     The patient was given the following medications:  Orders Placed This Encounter   Medications   ??? ondansetron (ZOFRAN) injection 4 mg   ??? fentaNYL (SUBLIMAZE) injection 50 mcg   ??? 0.9 % sodium chloride bolus   ??? promethazine (PHENERGAN) injection 12.5 mg   ??? ioversol (OPTIRAY) 74 % injection 130 mL   ??? fentaNYL (SUBLIMAZE) injection 50 mcg   ??? ondansetron (ZOFRAN) injection 4 mg   ??? ondansetron (ZOFRAN ODT) 4 MG disintegrating tablet     Sig: Take 1 tablet by mouth every 8 hours as needed for Nausea or Vomiting     Dispense:  10 tablet     Refill:  0   ??? oxyCODONE-acetaminophen (PERCOCET) 5-325 MG per tablet     Sig: Take 1 tablet by mouth every 4 hours as needed for Pain     Dispense:  10 tablet     Refill:  0       RECENT VITALS:  BP: 134/78, Temp: 98.1 ??F (36.7 ??C), Pulse: 81, Resp: 14     RADIOLOGY:All plain film, CT, MRI, and formal ultrasound images (except ED bedside ultrasound) are read by the radiologist and the images and interpretations are directly viewed by the emergency physician.   Ct Abdomen Pelvis W Iv Contrast Additional Contrast? None    Result Date: 06/12/2015  EXAM:  CT ABDOMEN PELVIS W IV CONTRAST  CLINICAL INDICATION: NAUSEA, VOMITING  TECHNIQUE: Axial CT images of the abdomen and pelvis were obtained.  Sagittal and coronal reconstructions  were performed.  IV Contrast: Intravenous contrast was administered. Oral Contrast: Oral contrast was not administered.    COMPARISON:  Abdominal/pelvic CT of 11/12/2010.  FINDINGS:  Lung Bases:  Within normal limits.  Liver:  Diffuse hepatic steatosis.   Biliary System:  Cholecystectomy.  Hepatic Vasculature:  The hepatic veins and portal veins are patent.  Spleen:  Within normal limits.  Pancreas:  Within normal limits.  Adrenal Glands:  No focal mass lesion.  Kidneys, Ureters, and Bladder:  No hydronephrosis.  Collapsed urinary bladder, limiting evaluation.  Lymph nodes:  Within normal limits.  Aorta/Retroperitoneum:  Within normal limits.  Bowel: Tiny hiatal hernia.  No bowel obstruction or pneumoperitoneum.  Appendectomy.  Pelvic Organs: A 25 mm left adnexal cyst.  Abdominal/Pelvic Wall:  Within normal limits.  Ascites:  No evidence of ascites.  Osseous Structures:  No significant abnormality.  Other Findings:  No other significant findings are noted.      1.  No bowel obstruction or pneumoperitoneum. 2.  Tiny hiatal hernia. 3.  Hepatic steatosis.   Report electronically signed by Dante Gang MD on 06/12/2015 10:31 PM        LABS: All lab results were reviewed by myself,  and all abnormals are listed below.  Labs Reviewed   COMPREHENSIVE METABOLIC PANEL - Abnormal; Notable for the following:     CO2 19 (*)     ALT 40 (*)     AST 44 (*)     All other components within normal limits   LIPASE - Abnormal; Notable for the following:     Lipase 107 (*)     All other components within normal limits   URINALYSIS - Abnormal; Notable for the following:     Color, UA DARK YELLOW (*)     Turbidity UA CLOUDY (*)     Urobilinogen, Urine ELEVATED (*)     All other components within normal limits   MICROSCOPIC URINALYSIS - Abnormal; Notable for the following:     Bacteria, UA MODERATE (*)     All other components within normal limits   POCT URINE PREGNANCY - Normal   CBC WITH AUTO DIFFERENTIAL   POCT HCG, PREGNANCY, UR         11:03  PM  Patient was reevaluated and updated on lab results.  At this time I do not believe patient needs to be admitted but she was to admit her.  Patient had discussion and she is willing to try going home she does see pain management and states that as long as she calls the doctor in the morning or be okay to give her some pain medication.      Disposition     DISPOSITION:    DISPOSITION Decision to Discharge    CLINICAL IMPRESSION:  1. Epigastric pain        PATIENT REFERRED TO:  Jola Babinski, MD  [address]  [address]  [place] [address]  [phone]    In 3 days      [facility] ED  [address]  [place] [address]  [phone]    If symptoms worsen      DISCHARGE MEDICATIONS:  New Prescriptions    ONDANSETRON (ZOFRAN ODT) 4 MG DISINTEGRATING TABLET    Take 1 tablet by mouth every 8 hours as needed for Nausea or Vomiting    OXYCODONE-ACETAMINOPHEN (PERCOCET) 5-325 MG PER TABLET    Take 1 tablet by mouth every 4 hours as needed for Pain             (Please note that portions of this note were completed with a voice recognition program.  Efforts were made to edit the dictations but occasionally words are mis-transcribed.)        Dorothe Pea, MD  06/12/15 2304

## 2015-06-12 NOTE — ED Provider Notes (Signed)
[facility]  eMERGENCY dEPARTMENT eNCOUnter      Pt Name: Linda Gilbert  MRN: [id number]  Birthdate 03/04/1986  Date of evaluation: 06/12/15      CHIEF COMPLAINT       Chief Complaint   Patient presents with   ??? Abdominal Pain     x 1 week   ??? Emesis         HISTORY OF PRESENT ILLNESS   HPI  Patient is a 29 year old female with a past medical history significant for chronic knee and back pain who presents the ED with recurrent nausea and vomiting. per patient states that nausea and vomiting started last week.  Has intermittent bouts of nausea and emesis.  No fevers.  Abdominal pain is described as diffuse.  No localizing area. Denies any dysuria urgency or frequency.  No vaginal bleeding or vaginal discharge.  No chest pain or shortness of breath. Has tried several medications at home without relief.  His concern for ongoing symptoms in here for evaluation.  REVIEW OF SYSTEMS       Review of Systems   Constitutional: Negative for chills and fever.   HENT: Negative for rhinorrhea and sore throat.    Eyes: Negative for photophobia and redness.   Respiratory: Negative for cough and shortness of breath.    Cardiovascular: Negative for chest pain and leg swelling.   Gastrointestinal: Positive for abdominal pain, nausea and vomiting.   Genitourinary: Negative for dysuria and hematuria.   Musculoskeletal: Negative for back pain and myalgias.   Skin: Negative for color change and wound.   Neurological: Negative for dizziness and headaches.       PAST MEDICAL HISTORY     Past Medical History   Diagnosis Date   ??? Bipolar 1 disorder (HCC)    ??? Constipation    ??? Depression    ??? Dizziness    ??? GERD (gastroesophageal reflux disease)    ??? Kidney stone    ??? Migraines    ??? Morbid obesity with BMI of 45.0-49.9, adult (HCC)    ??? Nausea      due to migraines and pain related   ??? Numbness and tingling      hands   ??? UTI (urinary tract infection)    ??? Wears glasses        SURGICAL HISTORY       Past Surgical History   Procedure  Laterality Date   ??? Knee surgery Bilateral      4 surgeries on right knee, 5 on left   ??? Appendectomy     ??? Tumor removal Left      mid back   ??? Ovarian cyst removal Bilateral    ??? Tonsillectomy     ??? Tubal ligation Bilateral    ??? Upper gastrointestinal endoscopy     ??? Cholecystectomy       stent also in bile duct   ??? Carpal tunnel release  12/08/13     right hand   ??? Other surgical history       stent in common bile       CURRENT MEDICATIONS       Previous Medications    CLOMIPHENE (SEROPHENE) 50 MG TABLET    Take 50 mg by mouth daily.    CLONAZEPAM (KLONOPIN) 0.5 MG TABLET    Take 0.5 mg by mouth 2 times daily as needed for Anxiety.    FENTANYL (DURAGESIC) 50 MCG/HR    Place 1  patch onto the skin every 72 hours    GABAPENTIN (NEURONTIN) 300 MG CAPSULE      Take 300 mg by mouth 4 times daily     GLUCOSAMINE-CHONDROIT-VIT C-MN (GLUCOSAMINE 1500 COMPLEX PO)    Take  by mouth.    IBUPROFEN (ADVIL;MOTRIN) 800 MG TABLET    Take 1 tablet by mouth every 8 hours as needed for Pain for 20 doses.    IBUPROFEN (ADVIL;MOTRIN) 800 MG TABLET    Take 800 mg by mouth every 8 hours as needed for Pain.    ILOPERIDONE (FANAPT) 1 MG TABS TABLET    Take 1 mg by mouth 2 times daily    LINACLOTIDE (LINZESS) 145 MCG CAPSULE    Take 145 mcg by mouth every morning (before breakfast).    MELOXICAM (MOBIC) 15 MG TABLET    Take 15 mg by mouth daily.    MORPHINE (KADIAN) 30 MG SR CAPSULE    Take 15 mg by mouth 2 times daily as needed for Pain.    OMEPRAZOLE (PRILOSEC) 20 MG CAPSULE      Take 20 mg by mouth 2 times daily     OXYCODONE-ACETAMINOPHEN (PERCOCET) 5-325 MG PER TABLET        OXYMORPHONE (OPANA) 10 MG TABLET        PROMETHAZINE (PHENERGAN) 12.5 MG TABLET    Take 25 mg by mouth every 8 hours as needed for Nausea.    TOPIRAMATE (TOPAMAX) 100 MG TABLET        ZOLPIDEM (AMBIEN) 10 MG TABLET    Take  by mouth nightly as needed for Sleep.       ALLERGIES     is allergic to dilaudid [hydromorphone]; tape [adhesive tape]; and vicodin  [hydrocodone-acetaminophen].    FAMILY HISTORY     has no family status information on file.      SOCIAL HISTORY      reports that she has been smoking Cigarettes.  She has a 9.00 pack-year smoking history. She has never used smokeless tobacco. She reports that she does not drink alcohol or use illicit drugs.    PHYSICAL EXAM     INITIAL VITALS:   Visit Vitals   ??? BP (!) 152/109   ??? Pulse 93   ??? Temp 98.1 ??F (36.7 ??C) (Oral)   ??? Resp 16   ??? Ht 5\' 3"  (1.6 m)   ??? Wt (!) 306 lb (138.8 kg)   ??? LMP 05/17/2015   ??? SpO2 99%   ??? BMI 54.21 kg/m2        Physical Exam   Constitutional: She is oriented to person, place, and time. She appears well-developed and well-nourished. No distress.   HENT:   Head: Normocephalic and atraumatic.   Eyes: Conjunctivae are normal. Pupils are equal, round, and reactive to light.   Neck: Neck supple. No tracheal deviation present.   Cardiovascular: Normal rate, regular rhythm and normal heart sounds.  Exam reveals no friction rub.    No murmur heard.  Pulmonary/Chest: Effort normal and breath sounds normal. No respiratory distress. She has no wheezes. She has no rales.   Abdominal: Soft. Bowel sounds are normal. She exhibits no distension. There is tenderness. There is no rebound and no guarding.   No localizing tenderness.  No rebound or guarding.  Positive bowel sounds.  No active vomiting seen during stay in ED.   Musculoskeletal: Normal range of motion. She exhibits no edema, tenderness or deformity.   Neurological: She is alert  and oriented to person, place, and time. No cranial nerve deficit.   Skin: Skin is warm and dry. No rash noted. She is not diaphoretic. No erythema. No pallor.   Psychiatric: She has a normal mood and affect. Judgment normal.   Nursing note and vitals reviewed.      MEDICAL DECISION MAKING:     MDM    DIAGNOSTIC RESULTS     EKG: All EKG's are interpreted by the Emergency Department Physician who either signs or Co-signs this chart in the absence of a  cardiologist.        RADIOLOGY:All plain film, CT, MRI, and formal ultrasound images (except ED bedside ultrasound) are read by the radiologist and the images and interpretations are directly viewed by the emergency physician.     No orders to display       LABS: All lab results were reviewed by myself, and all abnormals are listed below.  Labs Reviewed   COMPREHENSIVE METABOLIC PANEL - Abnormal; Notable for the following:     CO2 19 (*)     ALT 40 (*)     AST 44 (*)     All other components within normal limits   LIPASE - Abnormal; Notable for the following:     Lipase 107 (*)     All other components within normal limits   URINALYSIS - Abnormal; Notable for the following:     Color, UA DARK YELLOW (*)     Turbidity UA CLOUDY (*)     Urobilinogen, Urine ELEVATED (*)     All other components within normal limits   MICROSCOPIC URINALYSIS - Abnormal; Notable for the following:     Bacteria, UA MODERATE (*)     All other components within normal limits   CBC WITH AUTO DIFFERENTIAL   POCT HCG, PREGNANCY, UR   POCT URINE PREGNANCY       EMERGENCY DEPARTMENT COURSE:   Vitals:    Vitals:    06/12/15 1749 06/12/15 1755   BP:  (!) 152/109   Pulse: 93    Resp: 16    Temp: 98.1 ??F (36.7 ??C)    TempSrc: Oral    SpO2: 99%    Weight: (!) 306 lb (138.8 kg)    Height: 5\' 3"  (1.6 m)        The patient was given the following medications while in the emergency department:  Orders Placed This Encounter   Medications   ??? ondansetron (ZOFRAN) injection 4 mg   ??? fentaNYL (SUBLIMAZE) injection 50 mcg   ??? 0.9 % sodium chloride bolus   ??? promethazine (PHENERGAN) injection 12.5 mg       -------------------------    CRITICAL CARE:       CONSULTS:  None    PROCEDURES:  Procedures     FINAL IMPRESSION    No diagnosis found.      DISPOSITION/PLAN   DISPOSITION     PATIENT REFERRED TO:  No follow-up provider specified.    DISCHARGE MEDICATIONS:  New Prescriptions    No medications on file         Tonette Bihari, MD  Attending Emergency  Physician                     Tonette Bihari, MD  07/01/15 [phone]

## 2015-06-12 NOTE — ED Notes (Signed)
Pt eating crackers and drinking water     Lynnell Jude, RN  06/12/15 2016

## 2015-06-12 NOTE — ED Notes (Signed)
Pt presents to the ER c/o abd pain, N/V x1 week. Pt states she has not been able to keep anything down food or drink. LUQ pain that is tender with palpation.      Margarette Canada, RN  06/12/15 2018

## 2015-06-12 NOTE — ED Notes (Signed)
Pt reports that her nausea had improved, but that the doctor had her take some sips of water & eat some [employer] Ditto[employer]raham crackers.  (+) emesis noted after doing so.     Daisy Lazarodney E Ohanna Gassert, RN  06/12/15 2045

## 2015-06-13 ENCOUNTER — Inpatient Hospital Stay
Admit: 2015-06-13 | Discharge: 2015-06-14 | Disposition: A | Discharge status: Home / Self Care | Attending: Emergency Medicine

## 2015-06-13 DIAGNOSIS — R1013 Epigastric pain: Secondary | ICD-10-CM

## 2015-06-13 LAB — POCT URINE PREGNANCY: Preg Test, Ur: NEGATIVE

## 2015-06-13 MED ORDER — OXYCODONE-ACETAMINOPHEN 5-325 MG PO TABS
5-325 MG | ORAL_TABLET | ORAL | 0 refills | Status: DC | PRN
Start: 2015-06-13 — End: 2015-06-13

## 2015-06-13 MED ORDER — SODIUM CHLORIDE 0.9 % IV BOLUS
0.9 % | Freq: Once | INTRAVENOUS | Status: DC
Start: 2015-06-13 — End: 2015-06-13

## 2015-06-13 MED ORDER — ONDANSETRON HCL 4 MG/2ML IJ SOLN
4 MG/2ML | Freq: Once | INTRAMUSCULAR | Status: AC
Start: 2015-06-13 — End: 2015-06-12
  Administered 2015-06-13: 03:00:00 4 mg via INTRAVENOUS

## 2015-06-13 MED ORDER — FAMOTIDINE 20 MG/2ML IV SOLN
20 MG/2ML | Freq: Once | INTRAVENOUS | Status: DC
Start: 2015-06-13 — End: 2015-06-13

## 2015-06-13 MED ORDER — ONDANSETRON 4 MG PO TBDP
4 MG | ORAL_TABLET | Freq: Three times a day (TID) | ORAL | 0 refills | Status: DC | PRN
Start: 2015-06-13 — End: 2017-04-21

## 2015-06-13 MED ORDER — FENTANYL CITRATE (PF) 100 MCG/2ML IJ SOLN
100 MCG/2ML | Freq: Once | INTRAMUSCULAR | Status: AC
Start: 2015-06-13 — End: 2015-06-12
  Administered 2015-06-13: 03:00:00 50 ug via INTRAVENOUS

## 2015-06-13 MED ORDER — MORPHINE SULFATE (PF) 4 MG/ML IV SOLN
4 MG/ML | Freq: Once | INTRAVENOUS | Status: DC
Start: 2015-06-13 — End: 2015-06-13

## 2015-06-13 MED ORDER — IOVERSOL 74 % IV SOLN
74 % | Freq: Once | INTRAVENOUS | Status: AC | PRN
Start: 2015-06-13 — End: 2015-06-12
  Administered 2015-06-13: 02:00:00 130 mL via INTRAVENOUS

## 2015-06-13 MED ORDER — ONDANSETRON HCL 4 MG/2ML IJ SOLN
4 MG/2ML | Freq: Once | INTRAMUSCULAR | Status: DC
Start: 2015-06-13 — End: 2015-06-13

## 2015-06-13 MED FILL — MORPHINE SULFATE (PF) 4 MG/ML IV SOLN: 4 MG/ML | INTRAVENOUS | Qty: 1

## 2015-06-13 MED FILL — FAMOTIDINE 20 MG/2ML IV SOLN: 20 MG/2ML | INTRAVENOUS | Qty: 2

## 2015-06-13 MED FILL — FENTANYL CITRATE (PF) 100 MCG/2ML IJ SOLN: 100 MCG/2ML | INTRAMUSCULAR | Qty: 2

## 2015-06-13 MED FILL — ONDANSETRON HCL 4 MG/2ML IJ SOLN: 4 MG/2ML | INTRAMUSCULAR | Qty: 2

## 2015-06-13 NOTE — ED Notes (Signed)
Pt was unable to keep GI cocktail down.     Margarette CanadaSarah Camdon Saetern, RN  06/13/15 2110

## 2015-06-13 NOTE — Other (Addendum)
Patient Acct Nbr:  [id number]  Primary AUTH/CERT:    Primary Insurance Company Name:   [employer]  Primary Insurance Plan Name:  [id number]  Primary Insurance Group Number:    Primary Insurance Plan Type: W  Primary Insurance Policy Number:  [id number]

## 2015-06-13 NOTE — ED Notes (Addendum)
Pt presents to the ER c/o 8/10 abdominal pain. Pt was seen in the ER last night for the same complaint. Pt states the pain has gotten worse and she is unable to eat or drink at this time so she decided to needed to be re evaluated at this time. Pain originally started last weekend and has continued to get worse.      Margarette CanadaSarah Aneka Fagerstrom, RN  06/13/15 [phone]       Margarette CanadaSarah Markeis Allman, RN  06/13/15 [phone]

## 2015-06-13 NOTE — ED Provider Notes (Signed)
[place] [facility]  eMERGENCY dEPARTMENT eNCOUnter      Pt Name: KATELEE SCHUPP  MRN: [id number]  Birthdate 09/15/1986  Date of evaluation: 06/13/2015  PCP:    Jola Babinski, MD      CHIEF COMPLAINT       Chief Complaint   Patient presents with   ??? Abdominal Pain         HISTORY OF PRESENT ILLNESS      TANASHA MENEES is a 29 y.o. female who presents For evaluation for abdominal pain.  Patient states that she came in yesterday for the same thing.  They state that they wanted to keep her for pancreatitis however she decided she needed to go home.  He stated his CT scan and labs.  Patient states this pain and symptoms have persisted.  Does radiate to the back.  Sulci with nausea and vomiting.  Otherwise no other complaints       REVIEW OF SYSTEMS         Review of Systems   Constitutional: Negative for chills and fever.   HENT: Negative for congestion and rhinorrhea.    Eyes: Negative for pain and redness.   Respiratory: Negative for cough and shortness of breath.    Cardiovascular: Negative for chest pain and palpitations.   Gastrointestinal: Positive for abdominal pain, nausea and vomiting.   Genitourinary: Negative for dysuria, flank pain and urgency.   Musculoskeletal: Negative for back pain, myalgias, neck pain and neck stiffness.   Skin: Negative for rash.   Neurological: Negative for light-headedness and headaches.   Hematological: Does not bruise/bleed easily.        PAST MEDICAL HISTORY     Past Medical History   Diagnosis Date   ??? Bipolar 1 disorder (HCC)    ??? Constipation    ??? Depression    ??? Dizziness    ??? GERD (gastroesophageal reflux disease)    ??? Kidney stone    ??? Migraines    ??? Morbid obesity with BMI of 45.0-49.9, adult (HCC)    ??? Nausea      due to migraines and pain related   ??? Numbness and tingling      hands   ??? UTI (urinary tract infection)    ??? Wears glasses        SURGICAL HISTORY       Past Surgical History   Procedure Laterality Date   ??? Knee surgery Bilateral      4 surgeries on  right knee, 5 on left   ??? Appendectomy     ??? Tumor removal Left      mid back   ??? Ovarian cyst removal Bilateral    ??? Tonsillectomy     ??? Tubal ligation Bilateral    ??? Upper gastrointestinal endoscopy     ??? Cholecystectomy       stent also in bile duct   ??? Carpal tunnel release  12/08/13     right hand   ??? Other surgical history       stent in common bile       CURRENT MEDICATIONS       Previous Medications    BUPROPION (WELLBUTRIN XL) 150 MG XL TABLET    Take 150 mg by mouth every morning Indications: (Pt takes a 300mg  + a 150mg  tab = 450mg  XL in am) (Pt takes a 300mg  + a 150mg  tab = 450mg  XL in am)    BUPROPION (WELLBUTRIN XL)  300 MG XL TABLET    Take 300 mg by mouth every morning Indications: (Pt takes a  + a  tab =  XL in am) (Pt takes a  + a  tab =  XL in am)    CLONAZEPAM (KLONOPIN) 0.5 MG TABLET    Take 0.5 mg by mouth 2 times daily as needed for Anxiety.    FENTANYL (DURAGESIC) 50 MCG/HR    Place 1 patch onto the skin every 72 hours Indications: **Located on back in ED as of 8.9.16- due to be changed today** **Located on back in ED as of 8.9.16- due to be changed today**    IBUPROFEN (ADVIL;MOTRIN) 200 MG TABLET    Take 400 mg by mouth every 6 hours as needed for Pain    IBUPROFEN (ADVIL;MOTRIN) 800 MG TABLET    Take 1 tablet by mouth every 8 hours as needed for Pain for 20 doses.    LINACLOTIDE (LINZESS) 145 MCG CAPSULE    Take 145 mcg by mouth daily as needed     MELOXICAM (MOBIC) 15 MG TABLET    Take 15 mg by mouth daily.    ONDANSETRON (ZOFRAN ODT) 4 MG DISINTEGRATING TABLET    Take 1 tablet by mouth every 8 hours as needed for Nausea or Vomiting    OXYMORPHONE (OPANA) 10 MG TABLET    Take 10 mg by mouth every 8 hours as needed for Pain     PROMETHAZINE (PHENERGAN) 25 MG TABLET    Take 25 mg by mouth every 6 hours as needed for Nausea    TOPIRAMATE (TOPAMAX) 50 MG TABLET    Take 50 mg by mouth every morning Indications: (1 tab am ( ), 2 tabs ( ) in pm) (1 tab am  ( ), 2 tabs ( ) in pm)    TOPIRAMATE (TOPAMAX) 50 MG TABLET    Take 100 mg by mouth nightly Indications: (1 tab am ( ), 2 tabs ( ) in pm) (1 tab am ( ), 2 tabs ( ) in pm)    ZOLPIDEM (AMBIEN) 10 MG TABLET    Take  by mouth nightly as needed for Sleep.       ALLERGIES     Dilaudid [hydromorphone]; Tape [place] [employer] tape]; and Vicodin [hydrocodone-acetaminophen]    FAMILY HISTORY       Family Status   Relation Status   ??? Maternal Aunt    ??? Maternal Grandfather    ??? Paternal Grandfather    ??? Maternal Aunt    ??? Mother       Family History   Problem Relation Age of Onset   ??? Liver Cancer Maternal Aunt      died at 5 yrs old   ??? Lung Cancer Maternal Grandfather    ??? Cancer Paternal Grandfather    ??? Kidney Disease Maternal Aunt    ??? Thyroid Disease Mother        SOCIAL HISTORY       Social History     Social History   ??? Marital status: Single     Spouse name: N/A   ??? Number of children: N/A   ??? Years of education: N/A     Social History Main Topics   ??? Smoking status: Current Every Day Smoker     Packs/day: 0.75     Years: 12.00     Types: Cigarettes   ??? Smokeless tobacco: Never Used   ??? Alcohol use No   ??? Drug use: No   ??? Sexual activity: Yes  Other Topics Concern   ??? None     Social History Narrative       PHYSICAL EXAM     INITIAL VITALS:  oral temperature is 97.7 ??F (36.5 ??C). Her blood pressure is 154/101 (abnormal) and her pulse is 88. Her respiration is 18 and oxygen saturation is 96%.        Physical Exam   Constitutional: She is oriented to person, place, and time. She appears well-developed and well-nourished.   HENT:   Head: Normocephalic and atraumatic.   Nose: Nose normal.   Mouth/Throat: Oropharynx is clear and moist.   Eyes: Conjunctivae and EOM are normal. Pupils are equal, round, and reactive to light.   Neck: Normal range of motion. Neck supple.   Cardiovascular: Normal rate, regular rhythm, normal heart sounds and intact distal pulses.    Pulmonary/Chest: Effort normal and breath  sounds normal.   Abdominal: Soft. Bowel sounds are normal. There is tenderness (Mild to moderate midepigastric with some guarding yet no rebound).   Musculoskeletal: Normal range of motion.   Neurological: She is alert and oriented to person, place, and time.   Skin: Skin is warm and dry.   Nursing note and vitals reviewed.      DIFFERENTIAL DIAGNOSIS/ MDM:     Patient here with return if symptoms.  Yesterday was reviewed and CT was stable and lipase was mildly elevated.  At this time repeat labs will be done and symptom control be given    2055: patient was some improved symptoms.  Patient's lipase is coming down.  Overall this could be more of a gastric issues at this time she'll be changed from 1.  PPI to another with increased dose.  She was given a GI cocktail now.  Labs otherwise stable.  Patient does have Zofran at home per patient stable for discharge.  Again patient's CT as she was stable    DIAGNOSTIC RESULTS         LABS:  Labs Reviewed   COMPREHENSIVE METABOLIC PANEL - Abnormal; Notable for the following:     CO2 18 (*)     ALT 40 (*)     AST 42 (*)     All other components within normal limits   LIPASE - Abnormal; Notable for the following:     Lipase 92 (*)     All other components within normal limits   CBC WITH AUTO DIFFERENTIAL           EMERGENCY DEPARTMENT COURSE:   Vitals:    Vitals:    06/13/15 1901   BP: (!) 154/101   Pulse: 88   Resp: 18   Temp: 97.7 ??F (36.5 ??C)   TempSrc: Oral   SpO2: 96%     -------------------------  BP: (!) 154/101, Temp: 97.7 ??F (36.5 ??C), Pulse: 88, Resp: 18        FINAL IMPRESSION      1. Abdominal pain, epigastric    2. Acute pancreatitis, unspecified pancreatitis type          DISPOSITION/PLAN    DISPOSITION Decision to Discharge    PATIENT REFERRED TO:  Jola BabinskiKaleem U Gill, MD  [address]  [address]  [place] [id number]  [phone]    Call in 1 day        DISCHARGE MEDICATIONS:  New Prescriptions    PANTOPRAZOLE (PROTONIX) 20 MG TABLET    Take 2 tablets by  mouth daily       (  Please note that portions of this note were completed with a voice recognition program.  Efforts were made to edit the dictations but occasionally words are mis-transcribed.)    Christella Noa,, MD, MBA, [facility]  Attending Emergency Physician                       Christella Noa, MD  06/13/15 2055

## 2015-06-13 NOTE — Other (Addendum)
Patient Acct Nbr:  [id number]  Primary AUTH/CERT:    Primary Insurance Company Name:   [employer]  Primary Insurance Plan Name:  [id number]  Primary Insurance Group Number:    Primary Insurance Plan Type: W  Primary Insurance Policy Number:  [id number]

## 2015-06-13 NOTE — ED Notes (Addendum)
Clinical Pharmacist Medication Reconciliation    List of medications patient is currently taking is complete.  Source of medications in list is patient (pharmacy is closed at this time).      Please note:  Pt has a Fentanyl patch on- Located on back in ED as of 8.9.16. Due to be changed today.  Pt currently takes Meloxicam 15mg  + ibuprofen 200mg . Pt was educated on risks vs. benefits of this med. Risks include CV risks (VTE, MI, stroke, increased BP/HTN-pt's BP is 154/101), potential harm to the kidneys, and GI risks (bleed, ulcers, perforation). I have advised the pt to discuss w/ their physician.   ?? Recommendation: Discontinue NSAIDs and use acetaminophen. If absolutely necessary, use Naproxen as this has been shown to have the least CV risks.      Please let me know if you have any questions about this encounter. Thanks!    Audry Pili Hiram Comber, PharmD, Doreen Beam, RPh  ED Pharmacist  Pager: [phone]  06/13/2015  7:19 PM

## 2015-06-14 LAB — COMPREHENSIVE METABOLIC PANEL
ALT: 40 U/L — ABNORMAL HIGH (ref 5–33)
AST: 42 U/L — ABNORMAL HIGH (ref <32)
Albumin: 4.5 g/dL (ref 3.5–5.2)
Alkaline Phosphatase: 94 U/L (ref 35–104)
Anion Gap: 17 mmol/L (ref 9–17)
BUN: 10 mg/dL (ref 6–20)
CO2: 18 mmol/L — ABNORMAL LOW (ref 20–31)
Calcium: 9.6 mg/dL (ref 8.6–10.4)
Chloride: 105 mmol/L (ref 98–107)
Creatinine: 0.6 mg/dL (ref 0.50–0.90)
GFR African American: >60 mL/min (ref >60)
GFR Non-African American: >60 mL/min (ref >60)
Glucose: 94 mg/dL (ref 70–99)
Potassium: 3.9 mmol/L (ref 3.7–5.3)
Sodium: 140 mmol/L (ref 135–144)
Total Bilirubin: 0.39 mg/dL (ref 0.3–1.2)
Total Protein: 7.9 g/dL (ref 6.4–8.3)

## 2015-06-14 LAB — CBC WITH AUTO DIFFERENTIAL
Absolute Eos #: 0.3 10*3/uL (ref 0.0–0.4)
Absolute Lymph #: 2.6 10*3/uL (ref 1.0–4.8)
Absolute Mono #: 0.5 10*3/uL (ref 0.1–1.3)
Basophils Absolute: 0 10*3/uL (ref 0.0–0.2)
Basophils: 0 % (ref 0–2)
Eosinophils %: 3 % (ref 0–4)
Hematocrit: 38.4 % (ref 36–46)
Hemoglobin: 13.1 g/dL (ref 12.0–16.0)
Lymphocytes: 29 % (ref 24–44)
MCH: 28.4 pg (ref 26–34)
MCHC: 34.1 g/dL (ref 31–37)
MCV: 83.3 fL (ref 80–100)
MPV: 8.7 fL (ref 6.0–12.0)
Monocytes: 6 % (ref 1–7)
Platelets: 246 10*3/uL (ref 150–450)
RBC: 4.61 m/uL (ref 4.0–5.2)
RDW: 14.2 % (ref 11.5–14.9)
Seg Neutrophils: 62 % (ref 36–66)
Segs Absolute: 5.7 10*3/uL (ref 1.3–9.1)
WBC: 9.2 10*3/uL (ref 3.5–11.0)

## 2015-06-14 LAB — LIPASE: Lipase: 92 U/L — ABNORMAL HIGH (ref 13–60)

## 2015-06-14 MED ORDER — GI COCKTAIL
Freq: Once | Status: AC
Start: 2015-06-14 — End: 2015-06-13
  Administered 2015-06-14: 01:00:00 30 mL via ORAL

## 2015-06-14 MED ORDER — MORPHINE SULFATE (PF) 10 MG/ML IV SOLN
10 MG/ML | Freq: Once | INTRAVENOUS | Status: AC
Start: 2015-06-14 — End: 2015-06-13
  Administered 2015-06-14: 01:00:00 10 mg via INTRAMUSCULAR

## 2015-06-14 MED ORDER — FAMOTIDINE 20 MG PO TABS
20 MG | Freq: Once | ORAL | Status: AC
Start: 2015-06-14 — End: 2015-06-13
  Administered 2015-06-14: 01:00:00 20 mg via ORAL

## 2015-06-14 MED ORDER — PANTOPRAZOLE SODIUM 20 MG PO TBEC
20 MG | ORAL_TABLET | Freq: Every day | ORAL | 0 refills | Status: AC
Start: 2015-06-14 — End: ?

## 2015-06-14 MED ORDER — ONDANSETRON 4 MG PO TBDP
4 MG | Freq: Once | ORAL | Status: AC
Start: 2015-06-14 — End: 2015-06-13
  Administered 2015-06-14: 01:00:00 4 mg via ORAL

## 2015-06-14 MED FILL — MORPHINE SULFATE (PF) 10 MG/ML IV SOLN: 10 mg/mL | INTRAVENOUS | Qty: 1

## 2015-06-14 MED FILL — ONDANSETRON 4 MG PO TBDP: 4 MG | ORAL | Qty: 1

## 2015-06-14 MED FILL — FAMOTIDINE 20 MG PO TABS: 20 MG | ORAL | Qty: 1

## 2015-06-14 MED FILL — GI COCKTAIL: Qty: 1

## 2016-06-11 IMAGING — CR DG HAND COMPLETE 3+V*L*
3 series · 3 of 3 positions shown · non-contrast
Comparison: None.

CLINICAL DATA: Pain and swelling at the base of the thumb after a
fall today.

EXAM:
LEFT HAND - COMPLETE 3+ VIEW

[x hand pa left]
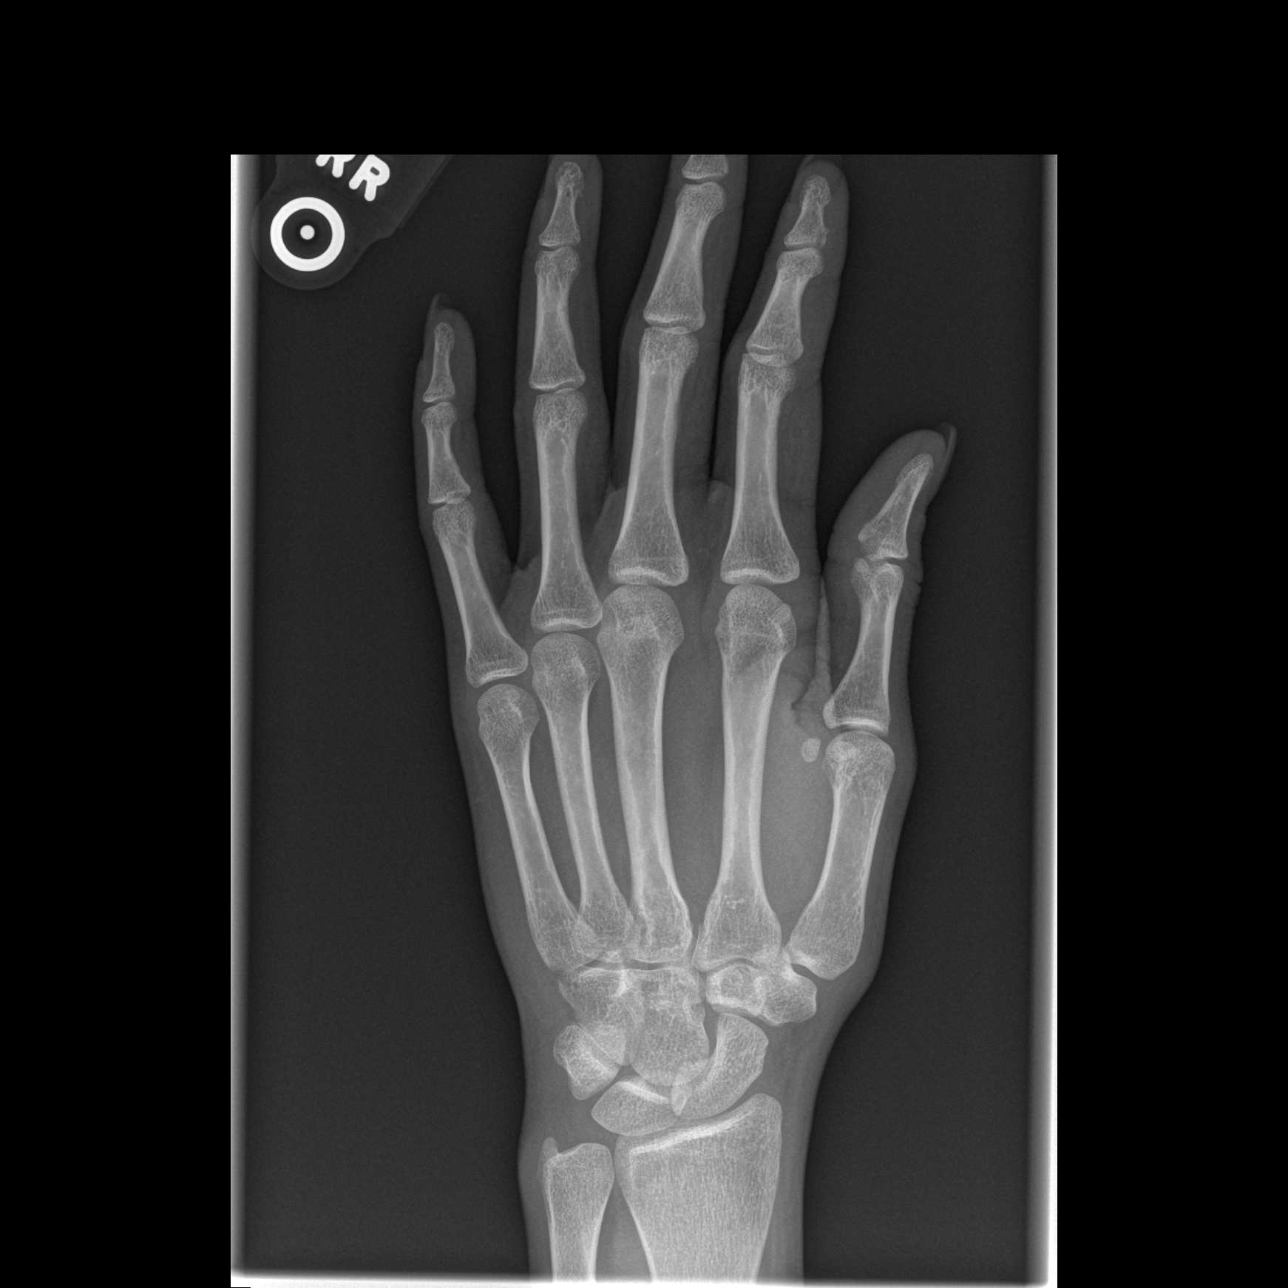

[x hand oblique left]
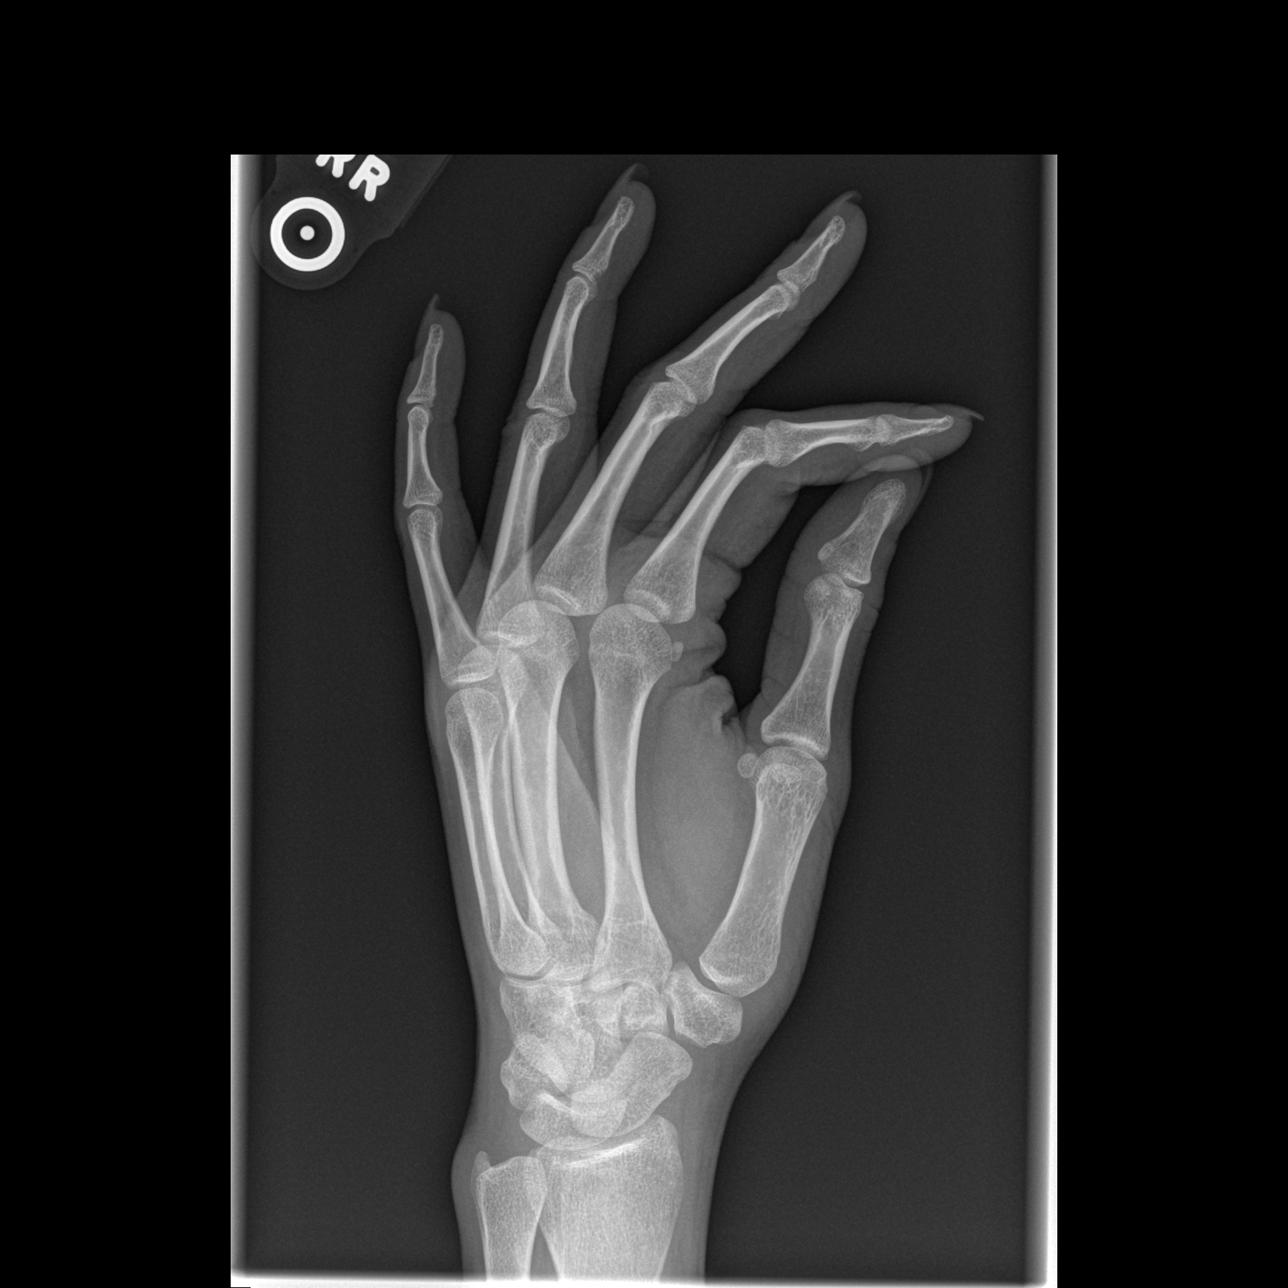

[x hand lat left]
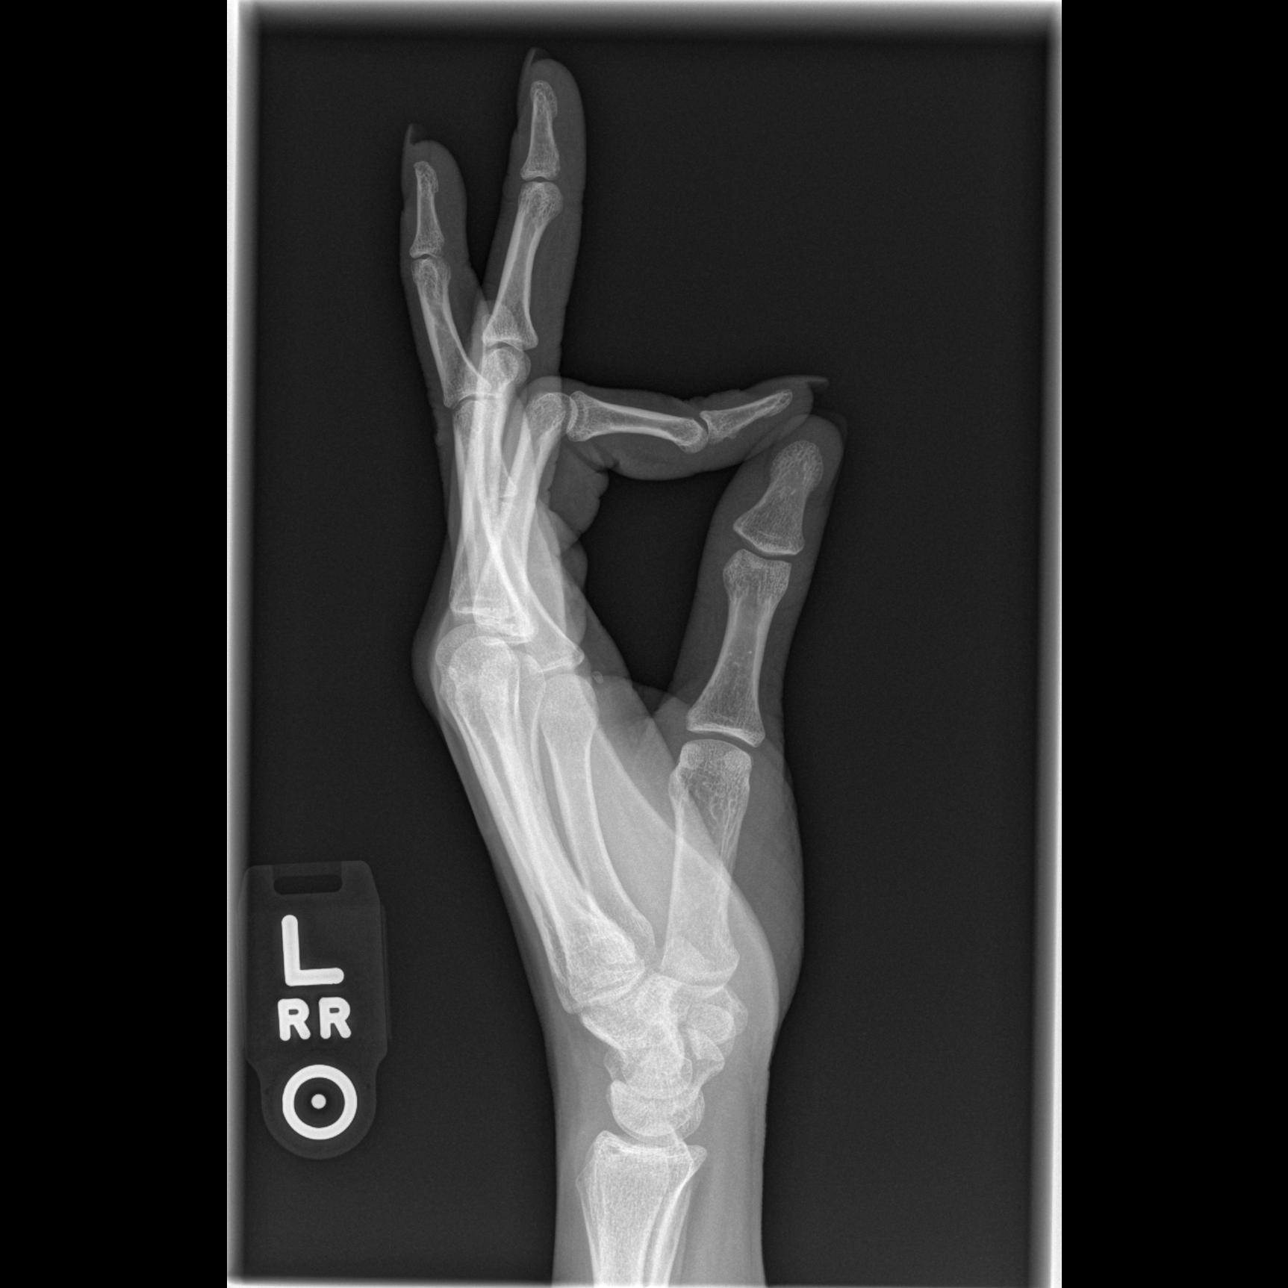

[3 of 3 positions shown; findings below may reference images not displayed]

FINDINGS: There is no evidence of fracture or dislocation. There is no
evidence of arthropathy or other focal bone abnormality. Soft
tissues are unremarkable.
IMPRESSION: Normal exam.

## 2016-08-29 ENCOUNTER — Encounter: Payer: PRIVATE HEALTH INSURANCE | Attending: Podiatrist | Primary: Family Medicine

## 2017-03-06 DIAGNOSIS — J028 Acute pharyngitis due to other specified organisms: Secondary | ICD-10-CM

## 2017-03-06 NOTE — ED Provider Notes (Signed)
[place] [facility] ED  eMERGENCY dEPARTMENT eNCOUnter      Pt Name: Linda Gilbert  MRN: [id number]  Birthdate [birth year]  Date of evaluation: 03/06/2017  Provider: Letta Pate, PA-C    CHIEF COMPLAINT       Chief Complaint   Patient presents with   . Cough   . Headache   . Generalized Body Aches           HISTORY OF PRESENT ILLNESS  (Location/Symptom, Timing/Onset, Context/Setting, Quality, Duration, Modifying Factors, Severity.)   Linda Gilbert is a 31 y.o. female who presents to the emergency department with complaints of headache, fever, body aches, chills, cough, ear pain, nasal congestion, sore throat, chest pain, shortness of breath, and nausea.  Pt states symptoms started yesterday.  States she is coughing up yellow phlegm.  States her chest is burning from coughing.  Reports she has to take shallow breaths to prevent herself from coughing.  Pt denies emesis, diarrhea.  No sick contacts.  Has been taking cold medicine at home.  Denies any hx of clot.  No other complaints.         Nursing Notes were reviewed.    REVIEW OF SYSTEMS    (2-9 systems for level 4, 10 or more for level 5)     Review of Systems   Constitutional: Positive for chills, fatigue and fever.   HENT: Positive for congestion, ear pain and sore throat. Negative for rhinorrhea.    Respiratory: Positive for cough and shortness of breath. Negative for chest tightness and wheezing.    Cardiovascular: Positive for chest pain.   Gastrointestinal: Positive for nausea. Negative for abdominal pain and vomiting.   Musculoskeletal: Positive for myalgias.   Skin: Negative for rash.   Neurological: Positive for headaches. Negative for dizziness and light-headedness.         Except as noted above the remainder of the review of systems was reviewed and negative.       PAST MEDICAL HISTORY     Past Medical History:   Diagnosis Date   . Bipolar 1 disorder (HCC)    . Constipation    . Depression    . Dizziness    . GERD (gastroesophageal reflux disease)    .  Kidney stone    . Migraines    . Morbid obesity with BMI of 45.0-49.9, adult (HCC)    . Nausea     due to migraines and pain related   . Numbness and tingling     hands   . UTI (urinary tract infection)    . Wears glasses      None otherwise stated in nurses notes    SURGICAL HISTORY       Past Surgical History:   Procedure Laterality Date   . APPENDECTOMY     . CARPAL TUNNEL RELEASE  12/08/13    right hand   . CHOLECYSTECTOMY      stent also in bile duct   . KNEE SURGERY Bilateral     4 surgeries on right knee, 5 on left   . OTHER SURGICAL HISTORY      stent in common bile   . OVARIAN CYST REMOVAL Bilateral    . TONSILLECTOMY     . TUBAL LIGATION Bilateral    . TUMOR REMOVAL Left     mid back   . UPPER GASTROINTESTINAL ENDOSCOPY       None otherwise stated in nurses notes    CURRENT MEDICATIONS  Previous Medications    BUPROPION (WELLBUTRIN XL) 150 MG XL TABLET    Take 150 mg by mouth every morning Indications: (Pt takes a 300mg  + a 150mg  tab = 450mg  XL in am) (Pt takes a 300mg  + a 150mg  tab = 450mg  XL in am)    BUPROPION (WELLBUTRIN XL) 300 MG XL TABLET    Take 300 mg by mouth every morning Indications: (Pt takes a 300mg  + a 150mg  tab = 450mg  XL in am) (Pt takes a 300mg  + a 150mg  tab = 450mg  XL in am)    CLONAZEPAM (KLONOPIN) 0.5 MG TABLET    Take 0.5 mg by mouth 2 times daily as needed for Anxiety.    FENTANYL (DURAGESIC) 50 MCG/HR    Place 1 patch onto the skin every 72 hours Indications: **Located on back in ED as of 8.9.16- due to be changed today** **Located on back in ED as of 8.9.16- due to be changed today**    IBUPROFEN (ADVIL;MOTRIN) 200 MG TABLET    Take 400 mg by mouth every 6 hours as needed for Pain    IBUPROFEN (ADVIL;MOTRIN) 800 MG TABLET    Take 1 tablet by mouth every 8 hours as needed for Pain for 20 doses.    LINACLOTIDE (LINZESS) 145 MCG CAPSULE    Take 145 mcg by mouth daily as needed     MELOXICAM (MOBIC) 15 MG TABLET    Take 15 mg by mouth daily.    ONDANSETRON (ZOFRAN ODT) 4 MG  DISINTEGRATING TABLET    Take 1 tablet by mouth every 8 hours as needed for Nausea or Vomiting    OXYMORPHONE (OPANA) 10 MG TABLET    Take 10 mg by mouth every 8 hours as needed for Pain     PANTOPRAZOLE (PROTONIX) 20 MG TABLET    Take 2 tablets by mouth daily    PROMETHAZINE (PHENERGAN) 25 MG TABLET    Take 25 mg by mouth every 6 hours as needed for Nausea    TOPIRAMATE (TOPAMAX) 50 MG TABLET    Take 50 mg by mouth every morning Indications: (1 tab am (50mg ), 2 tabs (100mg ) in pm) (1 tab am (50mg ), 2 tabs (100mg ) in pm)    TOPIRAMATE (TOPAMAX) 50 MG TABLET    Take 100 mg by mouth nightly Indications: (1 tab am (50mg ), 2 tabs (100mg ) in pm) (1 tab am (50mg ), 2 tabs (100mg ) in pm)    VORTIOXETINE HBR (TRINTELLIX) 20 MG TABS TABLET    Take 10 mg by mouth daily    ZOLPIDEM (AMBIEN) 10 MG TABLET    Take  by mouth nightly as needed for Sleep.       ALLERGIES     Dilaudid [hydromorphone]; Lamictal [lamotrigine]; Tape [adhesive tape]; and Vicodin [hydrocodone-acetaminophen]    FAMILY HISTORY           Problem Relation Age of Onset   . Liver Cancer Maternal Aunt      died at 31 yrs old   . Lung Cancer Maternal Grandfather    . Cancer Paternal Grandfather    . Kidney Disease Maternal Aunt    . Thyroid Disease Mother      Family Status   Relation Status   . Maternal Aunt    . Maternal Grandfather    . Paternal Grandfather    . Maternal Aunt    . Mother       None otherwise stated in nurses notes    SOCIAL HISTORY  reports that she has been smoking Cigarettes.  She has a 9.00 pack-year smoking history. She has never used smokeless tobacco. She reports that she does not drink alcohol or use drugs.   lives at home with others     PHYSICAL EXAM    (up to 7 for level 4, 8 or more for level 5)     ED Triage Vitals [03/06/17 2341]   BP Temp Temp Source Pulse Resp SpO2 Height Weight   (!) 149/98 99.2 F (37.3 C) Oral 120 18 98 % 5\' 3"  (1.6 m) 198 lb (89.8 kg)       Physical Exam   Nursing note and vitals  reviewed.  Constitutional: Oriented to person, place, and time and well-developed, well-nourished.   Head: Normocephalic and atraumatic.   Ear: External ears normal. TM non-erythematous bilaterally with no canal erythema.    Nose: Nose normal and midline.   Eyes: Conjunctivae and EOM are normal. Pupils are equal, round, and reactive to light.   Neck: Normal range of motion. Neck supple.   Throat: Posterior pharynx is mildly erythematous with no exudates, airway is patent, no swelling.  Uvula is midline.  Pt does not have tonsils.   Cardiovascular: Normal rate, regular rhythm, normal heart sounds and intact distal pulses.    Pulmonary/Chest: Effort normal and breath sounds normal. No respiratory distress. No wheezes. No rales. No chest tenderness.   Abdominal: Soft. Bowel sounds are normal. No distension and no mass. There is no tenderness. There is no rebound and no guarding.   Musculoskeletal: Normal range of motion.   Neurological: Alert and oriented to person, place, and time. GCS score is 15.   Skin: Skin is warm and dry. No rash noted. No erythema. No pallor.   Psychiatric: Mood, memory, affect and judgment normal.           DIAGNOSTIC RESULTS     EKG: All EKG's are interpreted by the Emergency Department Physician who either signs or Co-signs this chart in the absence of a cardiologist.        RADIOLOGY:   All plain film, CT, MRI, and formal ultrasound images (except ED bedside ultrasound) are read by the radiologist, see reports below, unless otherwise noted in MDM or here.   XR CHEST STANDARD (2 VW)    (Results Pending)           LABS:  Labs Reviewed   CBC WITH AUTO DIFFERENTIAL - Abnormal; Notable for the following:        Result Value    WBC 12.3 (*)     Seg Neutrophils 86 (*)     Lymphocytes 6 (*)     Segs Absolute 10.70 (*)     Absolute Lymph # 0.70 (*)     All other components within normal limits   BASIC METABOLIC PANEL - Abnormal; Notable for the following:     Glucose 123 (*)     Potassium 3.4 (*)      All other components within normal limits   RAPID INFLUENZA A/B ANTIGENS   STREP SCREEN GROUP A THROAT       All other labs were within normal range or not returned as of this dictation.    EMERGENCY DEPARTMENT COURSE and DIFFERENTIAL DIAGNOSIS/MDM:   Vitals:    Vitals:    03/06/17 2341 03/06/17 2345   BP: (!) 149/98 (!) 152/86   Pulse: 120    Resp: 18    Temp: 99.2 F (37.3 C)  TempSrc: Oral    SpO2: 98% 97%   Weight: 198 lb (89.8 kg)    Height: 5\' 3"  (1.6 m)          Patient instructed to return to the emergency room if symptoms worsen, return, or any other concern right away which is agreed by the patient    ED MEDS:  Orders Placed This Encounter   Medications   . 0.9 % sodium chloride bolus   . ketorolac (TORADOL) injection 30 mg   . benzonatate (TESSALON) capsule 100 mg         CONSULTS:  None    PROCEDURES:  EKG Interpretation    Interpreted by emergency department physician    Rhythm: sinus tachycardia  Rate: 100-110  Axis: normal  Ectopy: none  Conduction: normal  ST Segments: no acute change  T Waves: no acute change  Q Waves: none    EKG  Impression: no acute changes, sinus tachycardia.  Otherwise normal EKG.             FINAL IMPRESSION      1. Viral URI with cough          DISPOSITION/PLAN   DISPOSITION     PATIENT REFERRED TO:  Jola Babinski, MD  [address]  [address]  [place] [address]  [phone]    Call in 1 day      [facility] ED  [address]  [place] [address]  [phone]    If symptoms worsen      DISCHARGE MEDICATIONS:  New Prescriptions    No medications on file         Summation      Patient Course:  Headache, body aches, fever, chills, sore throat, congestion, ear pain, cough, sob, chest pain when coughing that started yesterday.  Pt is tachycardic on exam.  Will get ekg, chest xray, cbc, bmp, start iv fluids and give toradol.      At this time suspect infectious process.  Likely pneumonia vs influenza.   Low suspicion for PE or sepsis.       Influenza and  strep were unremarkable. CBC shows elevated white count at 12.3.  Suspect viral illness.     Will sign patient out to dr. Melvyn Neth who will wait for chest xray read and re-evaluate patient.       ED Medications administered this visit:    Medications   0.9 % sodium chloride bolus (1,000 mLs Intravenous New Bag 03/07/17 0037)   ketorolac (TORADOL) injection 30 mg (30 mg Intravenous Given 03/07/17 0037)   benzonatate (TESSALON) capsule 100 mg (100 mg Oral Given 03/07/17 0059)       New Prescriptions from this visit:    New Prescriptions    No medications on file       Follow-up:  Jola Babinski, MD  [address]  [address]  [place] [address]  [phone]    Call in 1 day      [facility] ED  [address]  [place] [address]  [phone]    If symptoms worsen        Final Impression:   1. Viral URI with cough               (Please note that portions of this note were completed with a voice recognition program.  Efforts were made to edit the dictations but occasionally words are mis-transcribed.)      (  Please note that portions of this note were completed with a voice recognition program.  Efforts were made to edit the dictations but occasionally words are mis-transcribed.)    Letta PateAdrienne C Lowe, PA-C           Letta PateAdrienne C Lowe, PA-C  03/07/17 0101

## 2017-03-06 NOTE — ED Provider Notes (Signed)
[facility]  eMERGENCY dEPARTMENT eNCOUnter   Attending Attestation     Pt Name: Linda Gilbert  MRN: [id number]  Birthdate 1986-09-23  Date of evaluation: 03/07/17       Linda Gilbert is a 31 y.o. female who presents with Cough; Headache; and Generalized Body Aches      History:   Patient has been ill since yesterday.  Patient states she's had generalized body aches burning in her chest headache cough nausea and congestion.patient is feeling better than when she first came in.    Exam: Vitals:   Vitals:    03/06/17 2341 03/06/17 2345 03/07/17 0100 03/07/17 0115   BP: (!) 149/98 (!) 152/86 139/66 122/72   Pulse: 120  110 103   Resp: 18  18 18    Temp: 99.2 F (37.3 C)      TempSrc: Oral      SpO2: 98% 97% 97% 96%   Weight: 198 lb (89.8 kg)      Height: 5\' 3"  (1.6 m)        Heart is slightly tachycardic regular no murmurs.  Lungs clear to auscultation bilaterally.  Abdomen soft nondistended and nontender.    I performed a history and physical examination of the patient and discussed management with the resident. I reviewed the resident's note and agree with the documented findings and plan of care. Any areas of disagreement are noted on the chart. I was personally present for the key portions of any procedures. I have documented in the chart those procedures where I was not present during the key portions. I have personally reviewed all images and agree with the resident's interpretation. I have reviewed the emergency nurses triage note. I agree with the chief complaint, past medical history, past surgical history, allergies, medications, social and family history as documented unless otherwise noted below. Documentation of the HPI, Physical Exam and Medical Decision Making performed by medical students or scribes is based on my personal performance of the HPI, PE and MDM. I personally evaluated and examined the patient in conjunction with the APC and agree with the assessment, treatment plan, and disposition of  the patient as recorded by the APC. Additional findings are as noted.    Landry CorporalJeromy Ellowyn Rieves, MD  Attending Emergency  Physician              Dorothe PeaJeromy R Stephanne Greeley, MD  03/07/17 0157

## 2017-03-07 ENCOUNTER — Emergency Department: Admit: 2017-03-07 | Payer: PRIVATE HEALTH INSURANCE | Primary: Family Medicine

## 2017-03-07 ENCOUNTER — Inpatient Hospital Stay
Admit: 2017-03-07 | Discharge: 2017-03-07 | Disposition: A | Discharge status: Home / Self Care | Payer: PRIVATE HEALTH INSURANCE | Attending: Emergency Medicine

## 2017-03-07 LAB — CBC WITH AUTO DIFFERENTIAL
Absolute Eos #: 0.2 10*3/uL (ref 0.0–0.4)
Absolute Lymph #: 0.7 10*3/uL — ABNORMAL LOW (ref 1.0–4.8)
Absolute Mono #: 0.7 10*3/uL (ref 0.1–1.3)
Basophils Absolute: 0.1 10*3/uL (ref 0.0–0.2)
Basophils: 1 % (ref 0–2)
Eosinophils %: 1 % (ref 0–4)
Hematocrit: 36.9 % (ref 36–46)
Hemoglobin: 12.2 g/dL (ref 12.0–16.0)
Lymphocytes: 6 % — ABNORMAL LOW (ref 24–44)
MCH: 29.9 pg (ref 26–34)
MCHC: 33 g/dL (ref 31–37)
MCV: 90.5 fL (ref 80–100)
MPV: 8.6 fL (ref 6.0–12.0)
Monocytes: 6 % (ref 1–7)
Platelets: 255 10*3/uL (ref 150–450)
RBC: 4.07 m/uL (ref 4.0–5.2)
RDW: 13.1 % (ref 11.5–14.9)
Seg Neutrophils: 86 % — ABNORMAL HIGH (ref 36–66)
Segs Absolute: 10.7 10*3/uL — ABNORMAL HIGH (ref 1.3–9.1)
WBC: 12.3 10*3/uL — ABNORMAL HIGH (ref 3.5–11.0)

## 2017-03-07 LAB — BASIC METABOLIC PANEL
Anion Gap: 15 mmol/L (ref 9–17)
BUN: 7 mg/dL (ref 6–20)
CO2: 20 mmol/L (ref 20–31)
Calcium: 8.7 mg/dL (ref 8.6–10.4)
Chloride: 100 mmol/L (ref 98–107)
Creatinine: 0.5 mg/dL (ref 0.50–0.90)
GFR African American: >60 mL/min (ref >60)
GFR Non-African American: >60 mL/min (ref >60)
Glucose: 123 mg/dL — ABNORMAL HIGH (ref 70–99)
Potassium: 3.4 mmol/L — ABNORMAL LOW (ref 3.7–5.3)
Sodium: 135 mmol/L (ref 135–144)

## 2017-03-07 LAB — EKG 12-LEAD
Atrial Rate: 109 {beats}/min
P Axis: 63 degrees
P-R Interval: 196 ms
Q-T Interval: 318 ms
QRS Duration: 90 ms
QTc Calculation (Bazett): 428 ms
R Axis: 76 degrees
T Axis: 26 degrees
Ventricular Rate: 109 {beats}/min

## 2017-03-07 LAB — RAPID INFLUENZA A/B ANTIGENS: Direct Exam: NEGATIVE

## 2017-03-07 LAB — STREP SCREEN GROUP A THROAT: Direct Exam: NEGATIVE

## 2017-03-07 MED ORDER — ALBUTEROL SULFATE HFA 108 (90 BASE) MCG/ACT IN AERS
108 (90 Base) MCG/ACT | RESPIRATORY_TRACT | 0 refills | Status: DC | PRN
Start: 2017-03-07 — End: 2017-04-21

## 2017-03-07 MED ORDER — IBUPROFEN 800 MG PO TABS
800 MG | ORAL_TABLET | Freq: Three times a day (TID) | ORAL | 0 refills | Status: AC | PRN
Start: 2017-03-07 — End: ?

## 2017-03-07 MED ORDER — SODIUM CHLORIDE 0.9 % IV BOLUS
0.9 % | Freq: Once | INTRAVENOUS | Status: AC
Start: 2017-03-07 — End: 2017-03-07
  Administered 2017-03-07: 05:00:00 1000 mL via INTRAVENOUS

## 2017-03-07 MED ORDER — KETOROLAC TROMETHAMINE 30 MG/ML IJ SOLN
30 MG/ML | Freq: Once | INTRAMUSCULAR | Status: AC
Start: 2017-03-07 — End: 2017-03-07
  Administered 2017-03-07: 05:00:00 30 mg via INTRAVENOUS

## 2017-03-07 MED ORDER — IPRATROPIUM-ALBUTEROL 0.5-2.5 (3) MG/3ML IN SOLN
RESPIRATORY_TRACT | Status: DC | PRN
Start: 2017-03-07 — End: 2017-03-07
  Administered 2017-03-07: 06:00:00 1 via RESPIRATORY_TRACT

## 2017-03-07 MED ORDER — FENTANYL CITRATE (PF) 100 MCG/2ML IJ SOLN
100 MCG/2ML | Freq: Once | INTRAMUSCULAR | Status: AC
Start: 2017-03-07 — End: 2017-03-07
  Administered 2017-03-07: 06:00:00 89.8 ug/kg via INTRAVENOUS

## 2017-03-07 MED ORDER — BENZONATATE 100 MG PO CAPS
100 MG | ORAL_CAPSULE | Freq: Three times a day (TID) | ORAL | 0 refills | Status: AC | PRN
Start: 2017-03-07 — End: 2017-03-14

## 2017-03-07 MED ORDER — ALBUTEROL SULFATE (2.5 MG/3ML) 0.083% IN NEBU
RESPIRATORY_TRACT | Status: DC | PRN
Start: 2017-03-07 — End: 2017-03-07

## 2017-03-07 MED ORDER — BENZONATATE 100 MG PO CAPS
100 MG | Freq: Once | ORAL | Status: AC
Start: 2017-03-07 — End: 2017-03-07
  Administered 2017-03-07: 05:00:00 100 mg via ORAL

## 2017-03-07 MED FILL — BENZONATATE 100 MG PO CAPS: 100 MG | ORAL | Qty: 1

## 2017-03-07 MED FILL — FENTANYL CITRATE (PF) 100 MCG/2ML IJ SOLN: 100 MCG/2ML | INTRAMUSCULAR | Qty: 2

## 2017-03-07 MED FILL — KETOROLAC TROMETHAMINE 30 MG/ML IJ SOLN: 30 MG/ML | INTRAMUSCULAR | Qty: 1

## 2017-03-07 NOTE — Progress Notes (Signed)
   Bronchodilator assessment   [x]    Bronchodilator Assessment        Bronchodilator assessment at level  *2**  BRONCHODILATOR ASSESSMENT SCORE  Score 1 2 3 4   Breath Sounds   []  Clear [x]  Mild Wheezing with good aeration []  Moderate I/E wheezing with adequate aeration []  Poor Aeration or diffuse wheezing   Respiratory Rate [x]  Less than 20 []  20-25 []  Greater than 25  []  Greater than 35    Dyspnea [x]  No SOB  [x]  SOB with minimal activity []  Speaking in partial sentences []  Acute/ At rest   Peakflow (asthma) []  80 % or greater predicted/PB  []  Unable []  70% or greater predicted/PB  [x]  Unable []  51%-70% predicted/PB  []  Unable []  Less than 50% predicted/PB  []  Unable due to distress   FEV1 % Predicted []  Greater than 69%  []  Unable  []  Less than 50%-69%  [x]  Unable  []  Less than 35%-49%  []  Unable  []  Less than 35%  []  Unable due to distress

## 2017-04-17 ENCOUNTER — Inpatient Hospital Stay
Admit: 2017-04-17 | Discharge: 2017-04-17 | Disposition: A | Discharge status: Home / Self Care | Payer: PRIVATE HEALTH INSURANCE | Attending: Emergency Medicine

## 2017-04-17 DIAGNOSIS — L03111 Cellulitis of right axilla: Secondary | ICD-10-CM

## 2017-04-17 MED ORDER — SULFAMETHOXAZOLE-TRIMETHOPRIM 800-160 MG PO TABS
800-160 MG | Freq: Once | ORAL | Status: AC
Start: 2017-04-17 — End: 2017-04-17
  Administered 2017-04-17: 23:00:00 1 via ORAL

## 2017-04-17 MED ORDER — CEPHALEXIN 250 MG PO CAPS
250 MG | Freq: Once | ORAL | Status: AC
Start: 2017-04-17 — End: 2017-04-17
  Administered 2017-04-17: 23:00:00 500 mg via ORAL

## 2017-04-17 MED FILL — SULFAMETHOXAZOLE-TRIMETHOPRIM 800-160 MG PO TABS: 800-160 MG | ORAL | Qty: 1

## 2017-04-17 MED FILL — CEPHALEXIN 250 MG PO CAPS: 250 MG | ORAL | Qty: 2

## 2017-04-17 NOTE — ED Provider Notes (Signed)
[facility] [facility]  eMERGENCY dEPARTMENT eNCOUnter   Independent Attestation     Pt Name: Linda Gilbert  MRN: [id number]  Birthdate 1986/03/16  Date of evaluation: 04/17/17       Linda Gilbert is a [age] y.o. female who presents with Abscess        Based on the medical record, the care appears appropriate. I was personally available for consultation in the Emergency Department.    Dorothe PeaJeromy R Shakeeta Godette, MD  Attending Emergency  Physician                  Dorothe PeaJeromy R Thatiana Renbarger, MD  04/17/17 [phone]

## 2017-04-17 NOTE — ED Provider Notes (Signed)
[facility] ED  eMERGENCY dEPARTMENT eNCOUnter      Pt Name: Linda Gilbert  MRN: [id number]  Birthdate 09/28/1986  Date of evaluation: 04/17/2017  Provider: Stanford Breed, APRN - CNP    CHIEF COMPLAINT       Chief Complaint   Patient presents with   ??? Abscess         HISTORY OF PRESENT ILLNESS  (Location/Symptom, Timing/Onset, Context/Setting, Quality, Duration, Modifying Factors, Severity.)   Linda Gilbert is a 31 y.o. female who presents to the emergency department With complaints of abscess to right axillary area.  Patient was evaluated by her family doctor and surgeon today.  Patient is scheduled for surgery on Monday to have abscess drained and packed.  Patient states that she saw the surgeon at 2:00 and relates that the redness and pain has gotten worse so she came to ED for evaluation.  Patient reports that she had a fever and has pain in her joints.  Patient was prescribed Bactrim and Keflex but did not fill antibiotics yet.  No vomiting.  Appears uncomfortable.    Nursing Notes were reviewed and I agree.    REVIEW OF SYSTEMS    (2-9 systems for level 4, 10 or more for level 5)     Review of Systems   Constitutional: Negative for chills and fever.   HENT: Negative for trouble swallowing.    Respiratory: Negative for cough and shortness of breath.    Cardiovascular: Negative for chest pain and palpitations.   Gastrointestinal: Negative for nausea and vomiting.   Skin: Positive for color change (abscess right axilla).     Except as noted above the remainder of the review of systems was reviewed and negative.       PAST MEDICAL HISTORY         Diagnosis Date   ??? Bipolar 1 disorder (HCC)    ??? Constipation    ??? Depression    ??? Dizziness    ??? GERD (gastroesophageal reflux disease)    ??? Kidney stone    ??? Migraines    ??? Morbid obesity with BMI of 45.0-49.9, adult (HCC)    ??? Nausea     due to migraines and pain related   ??? Numbness and tingling     hands   ??? UTI (urinary tract infection)    ??? Wears glasses       Reviewed.  SURGICAL HISTORY           Procedure Laterality Date   ??? APPENDECTOMY     ??? CARPAL TUNNEL RELEASE  12/08/13    right hand   ??? CHOLECYSTECTOMY      stent also in bile duct   ??? KNEE SURGERY Bilateral     4 surgeries on right knee, 5 on left   ??? OTHER SURGICAL HISTORY      stent in common bile   ??? OVARIAN CYST REMOVAL Bilateral    ??? TONSILLECTOMY     ??? TUBAL LIGATION Bilateral    ??? TUMOR REMOVAL Left     mid back   ??? UPPER GASTROINTESTINAL ENDOSCOPY       Reviewed.  CURRENT MEDICATIONS       Previous Medications    ALBUTEROL SULFATE HFA (PROVENTIL HFA) 108 (90 BASE) MCG/ACT INHALER    Inhale 2 puffs into the lungs every 4 hours as needed for Wheezing or Shortness of Breath (Space out to every 6 hours as symptoms improve) Space out to  every 6 hours as symptoms improve.    BUPROPION (WELLBUTRIN XL) 150 MG XL TABLET    Take 150 mg by mouth every morning Indications: (Pt takes a 300mg  + a 150mg  tab = 450mg  XL in am) (Pt takes a 300mg  + a 150mg  tab = 450mg  XL in am)    BUPROPION (WELLBUTRIN XL) 300 MG XL TABLET    Take 300 mg by mouth every morning Indications: (Pt takes a 300mg  + a 150mg  tab = 450mg  XL in am) (Pt takes a 300mg  + a 150mg  tab = 450mg  XL in am)    CLONAZEPAM (KLONOPIN) 0.5 MG TABLET    Take 0.5 mg by mouth 2 times daily as needed for Anxiety.    FENTANYL (DURAGESIC) 50 MCG/HR    Place 1 patch onto the skin every 72 hours Indications: **Located on back in ED as of 8.9.16- due to be changed today** **Located on back in ED as of 8.9.16- due to be changed today**    IBUPROFEN (ADVIL;MOTRIN) 200 MG TABLET    Take 400 mg by mouth every 6 hours as needed for Pain    IBUPROFEN (ADVIL;MOTRIN) 800 MG TABLET    Take 1 tablet by mouth every 8 hours as needed for Pain for 20 doses.    IBUPROFEN (ADVIL;MOTRIN) 800 MG TABLET    Take 1 tablet by mouth every 8 hours as needed for Pain    LINACLOTIDE (LINZESS) 145 MCG CAPSULE    Take 145 mcg by mouth daily as needed     MELOXICAM (MOBIC) 15 MG TABLET    Take 15  mg by mouth daily.    ONDANSETRON (ZOFRAN ODT) 4 MG DISINTEGRATING TABLET    Take 1 tablet by mouth every 8 hours as needed for Nausea or Vomiting    OXYMORPHONE (OPANA) 10 MG TABLET    Take 10 mg by mouth every 8 hours as needed for Pain     PANTOPRAZOLE (PROTONIX) 20 MG TABLET    Take 2 tablets by mouth daily    PROMETHAZINE (PHENERGAN) 25 MG TABLET    Take 25 mg by mouth every 6 hours as needed for Nausea    TOPIRAMATE (TOPAMAX) 50 MG TABLET    Take 50 mg by mouth every morning Indications: (1 tab am (50mg ), 2 tabs (100mg ) in pm) (1 tab am (50mg ), 2 tabs (100mg ) in pm)    TOPIRAMATE (TOPAMAX) 50 MG TABLET    Take 100 mg by mouth nightly Indications: (1 tab am (50mg ), 2 tabs (100mg ) in pm) (1 tab am (50mg ), 2 tabs (100mg ) in pm)    VORTIOXETINE HBR (TRINTELLIX) 20 MG TABS TABLET    Take 10 mg by mouth daily    ZOLPIDEM (AMBIEN) 10 MG TABLET    Take  by mouth nightly as needed for Sleep.       ALLERGIES     Dilaudid [hydromorphone]; Lamictal [lamotrigine]; Tape [place] [employer] tape]; and Vicodin [hydrocodone-acetaminophen]    FAMILY HISTORY           Problem Relation Age of Onset   ??? Liver Cancer Maternal Aunt      died at 21 yrs old   ??? Lung Cancer Maternal Grandfather    ??? Cancer Paternal Grandfather    ??? Kidney Disease Maternal Aunt    ??? Thyroid Disease Mother      Family Status   Relation Status   ??? Maternal Aunt    ??? Maternal Grandfather    ??? Paternal Grandfather    ???  Maternal Aunt    ??? Mother       Reviewed and not relevant.    SOCIAL HISTORY      reports that she has been smoking Cigarettes.  She has a 9.00 pack-year smoking history. She has never used smokeless tobacco. She reports that she does not drink alcohol or use drugs.  Reviewed.   PHYSICAL EXAM    (up to 7 for level 4, 8 or more for level 5)     ED Triage Vitals [04/17/17 1820]   BP Temp Temp Source Pulse Resp SpO2 Height Weight   132/79 99.8 ??F (37.7 ??C) Oral 104 18 100 % 5\' 3"  (1.6 m) 199 lb (90.3 kg)       Physical Exam   Constitutional: She is  oriented to person, place, and time. She appears well-developed and well-nourished. No distress.   HENT:   Head: Normocephalic and atraumatic.   Right Ear: External ear normal.   Left Ear: External ear normal.   Nose: Nose normal.   Eyes: Right eye exhibits no discharge. Left eye exhibits no discharge. No scleral icterus.   Neck: Normal range of motion. No tracheal deviation present.   Pulmonary/Chest: Effort normal. No stridor. No respiratory distress.   Musculoskeletal: Normal range of motion. She exhibits no edema.   Neurological: She is alert and oriented to person, place, and time. Coordination normal.   Skin: Skin is warm and dry. She is not diaphoretic.        Psychiatric: She has a normal mood and affect. Her behavior is normal.       DIAGNOSTIC RESULTS     RADIOLOGY:       ABSCESS ULTRASOUND:      A limited, bedside ultrasound of the skin was performed by Dr Melvyn NethLewis.  The medical necessity was to evaluate for the presence or absence of an abscess.     FINDINGS:  No definitive area of fluid collection present.  Findings consistent with possible small abscess and cellulitis.        LABS:  Labs Reviewed - No data to display    All other labs were within normal range or not returned as of this dictation.    EMERGENCY DEPARTMENT COURSE and DIFFERENTIAL DIAGNOSIS/MDM:   Patient presents to ED with complaints of Abscess to right axilla. US does not show definitive fluid collection.  Plan of care discussed with attending physician.  At this time no evidence of sepsis, no workup needed.  Patient is to fill her antibiotics and take as directed, first dose in ED.  Continue pain medication as previous prescribed.  Follow-up with on Monday for an abscess I&D.  Okay to discharge home.  Follow-up with family doctor or clinic of choice in 1 or 2 days for a recheck.  Return to ED if any worsening or new symptoms.      Vitals:    Vitals:    04/17/17 1820   BP: 132/79   Pulse: 104   Resp: 18   Temp: 99.8 ??F (37.7 ??C)   TempSrc:  Oral   SpO2: 100%   Weight: 199 lb (90.3 kg)   Height: 5\' 3"  (1.6 m)       Vitals reviewed.     PROCEDURES:  None    FINAL IMPRESSION      1. Cellulitis of right axilla    2. Axillary abscess          DISPOSITION/PLAN   DISPOSITION Decision To Discharge 04/17/2017 06:40:29 PM  PATIENT REFERRED TO:  Jola Babinski, MD  [address]  [address]  [place] [address]  [phone]    Schedule an appointment as soon as possible for a visit   Follow up visit    [facility] ED  [address]  [place] [address]  [phone]    If symptoms worsen      DISCHARGE MEDICATIONS:  New Prescriptions    No medications on file       (Please note that portions of this note were completed with a voice recognition program.  Efforts were made to edit the dictations but occasionally words are mis-transcribed.)    Abdirahman Chittum, APRN - CNP        Stanford Breed, APRN - CNP  04/17/17 1851       Stanford Breed, APRN - CNP  04/17/17 1852

## 2017-04-17 NOTE — ED Notes (Signed)
C= "Have you ever felt that you should Cut down on your drinking?"  No  A= "Have people Annoyed you by criticizing your drinking?"  No  G= "Have you ever felt bad or Guilty about your drinking?"  No  E= "Have you ever had a drink as an Eye-opener first thing in the morning to steady your nerves or to help a            hangover?"  No      Deferred []       Reason for deferring: N/A    *If yes to two or more: probable alcohol abuse.*       Antonietta JewelNicholas R Tymar Polyak, RN  04/17/17 [phone]

## 2017-04-21 ENCOUNTER — Observation Stay
Admit: 2017-04-21 | Discharge: 2017-04-22 | Disposition: A | Discharge status: Home / Self Care | Payer: PRIVATE HEALTH INSURANCE | Source: Ambulatory Visit | Attending: Surgery | Admitting: Surgery

## 2017-04-21 DIAGNOSIS — L02411 Cutaneous abscess of right axilla: Secondary | ICD-10-CM

## 2017-04-21 LAB — POCT HCG, PREGNANCY, UR: HCG, Pregnancy Urine (POC): NEGATIVE

## 2017-04-21 MED ORDER — LIDOCAINE-EPINEPHRINE 1 %-1:100000 IJ SOLN
1 | INTRAMUSCULAR | Status: AC
Start: 2017-04-21 — End: 2017-04-21

## 2017-04-21 MED ORDER — MEPERIDINE HCL 50 MG/ML IJ SOLN
50 MG/ML | INTRAMUSCULAR | Status: DC | PRN
Start: 2017-04-21 — End: 2017-04-21

## 2017-04-21 MED ORDER — PANTOPRAZOLE SODIUM 40 MG PO TBEC
40 MG | Freq: Every day | ORAL | Status: DC
Start: 2017-04-21 — End: 2017-04-22
  Administered 2017-04-21 – 2017-04-22 (×2): 40 mg via ORAL

## 2017-04-21 MED ORDER — NORMAL SALINE FLUSH 0.9 % IV SOLN
0.9 % | Freq: Two times a day (BID) | INTRAVENOUS | Status: DC
Start: 2017-04-21 — End: 2017-04-22
  Administered 2017-04-22: 12:00:00 10 mL via INTRAVENOUS

## 2017-04-21 MED ORDER — FENTANYL CITRATE (PF) 100 MCG/2ML IJ SOLN
100 MCG/2ML | INTRAMUSCULAR | Status: DC | PRN
Start: 2017-04-21 — End: 2017-04-21

## 2017-04-21 MED ORDER — BUPIVACAINE HCL (PF) 0.25 % IJ SOLN
0.25 | INTRAMUSCULAR | Status: AC
Start: 2017-04-21 — End: 2017-04-21

## 2017-04-21 MED ORDER — ONDANSETRON HCL 4 MG/2ML IJ SOLN
4 MG/2ML | INTRAMUSCULAR | Status: DC | PRN
Start: 2017-04-21 — End: 2017-04-21
  Administered 2017-04-21: 13:00:00 4 via INTRAVENOUS

## 2017-04-21 MED ORDER — MORPHINE SULFATE 2 MG/ML IJ SOLN
2 MG/ML | INTRAMUSCULAR | Status: DC | PRN
Start: 2017-04-21 — End: 2017-04-21

## 2017-04-21 MED ORDER — VANCOMYCIN HCL 1000 MG IV SOLR
1000 MG | Freq: Two times a day (BID) | INTRAVENOUS | Status: DC
Start: 2017-04-21 — End: 2017-04-22
  Administered 2017-04-21 – 2017-04-22 (×2): 1250 mg via INTRAVENOUS

## 2017-04-21 MED ORDER — MORPHINE SULFATE (PF) 2 MG/ML IV SOLN
2 MG/ML | INTRAVENOUS | Status: DC | PRN
Start: 2017-04-21 — End: 2017-04-21
  Administered 2017-04-21 (×3): 1 mg via INTRAVENOUS

## 2017-04-21 MED ORDER — FENTANYL CITRATE (PF) 100 MCG/2ML IJ SOLN
100 | INTRAMUSCULAR | Status: AC
Start: 2017-04-21 — End: 2017-04-21

## 2017-04-21 MED ORDER — MIDAZOLAM HCL 2 MG/2ML IJ SOLN
2 MG/ML | INTRAMUSCULAR | Status: DC | PRN
Start: 2017-04-21 — End: 2017-04-21
  Administered 2017-04-21: 13:00:00 2 via INTRAVENOUS

## 2017-04-21 MED ORDER — MIDAZOLAM HCL 2 MG/2ML IJ SOLN
2 | INTRAMUSCULAR | Status: AC
Start: 2017-04-21 — End: 2017-04-21

## 2017-04-21 MED ORDER — OXYCODONE-ACETAMINOPHEN 5-325 MG PO TABS
5-325 MG | ORAL | Status: DC | PRN
Start: 2017-04-21 — End: 2017-04-22
  Administered 2017-04-21 – 2017-04-22 (×5): 2 via ORAL

## 2017-04-21 MED ORDER — AMPHETAMINE-DEXTROAMPHET ER 10 MG PO CP24
10 MG | Freq: Every day | ORAL | Status: DC
Start: 2017-04-21 — End: 2017-04-22
  Administered 2017-04-21 – 2017-04-22 (×2): 20 mg via ORAL

## 2017-04-21 MED ORDER — ONDANSETRON HCL 4 MG/2ML IJ SOLN
4 MG/2ML | INTRAMUSCULAR | Status: DC | PRN
Start: 2017-04-21 — End: 2017-04-22
  Administered 2017-04-22 (×2): 4 mg via INTRAVENOUS

## 2017-04-21 MED ORDER — LACTATED RINGERS IV SOLN
INTRAVENOUS | Status: DC
Start: 2017-04-21 — End: 2017-04-22
  Administered 2017-04-21: 16:00:00 via INTRAVENOUS

## 2017-04-21 MED ORDER — VORTIOXETINE HBR 10 MG PO TABS
10 MG | Freq: Every day | ORAL | Status: DC
Start: 2017-04-21 — End: 2017-04-22
  Administered 2017-04-21 – 2017-04-22 (×2): 10 mg via ORAL

## 2017-04-21 MED ORDER — ONDANSETRON HCL 4 MG/2ML IJ SOLN
4 MG/2ML | Freq: Four times a day (QID) | INTRAMUSCULAR | Status: DC | PRN
Start: 2017-04-21 — End: 2017-04-21
  Administered 2017-04-21: 12:00:00 4 mg via INTRAVENOUS

## 2017-04-21 MED ORDER — DEXTROSE 5 % IV SOLN (MINI-BAG)
5 % | Freq: Two times a day (BID) | INTRAVENOUS | Status: DC
Start: 2017-04-21 — End: 2017-04-21

## 2017-04-21 MED ORDER — ENOXAPARIN SODIUM 30 MG/0.3ML SC SOLN
30 MG/0.3ML | Freq: Every day | SUBCUTANEOUS | Status: DC
Start: 2017-04-21 — End: 2017-04-22

## 2017-04-21 MED ORDER — NORMAL SALINE FLUSH 0.9 % IV SOLN
0.9 % | Freq: Two times a day (BID) | INTRAVENOUS | Status: DC
Start: 2017-04-21 — End: 2017-04-21

## 2017-04-21 MED ORDER — PROMETHAZINE HCL 25 MG/ML IJ SOLN
25 MG/ML | Freq: Once | INTRAMUSCULAR | Status: AC | PRN
Start: 2017-04-21 — End: 2017-04-21
  Administered 2017-04-21: 12:00:00 6.25 mg via INTRAVENOUS

## 2017-04-21 MED ORDER — VANCOMYCIN INTERMITTENT DOSING (PLACEHOLDER)
INTRAVENOUS | Status: DC
Start: 2017-04-21 — End: 2017-04-22

## 2017-04-21 MED ORDER — CEFAZOLIN 2000 MG D5W 50 ML IVPB
Freq: Once | Status: AC
Start: 2017-04-21 — End: 2017-04-21
  Administered 2017-04-21: 13:00:00 2 g via INTRAVENOUS

## 2017-04-21 MED ORDER — CEFAZOLIN SODIUM 1 G IJ SOLR
1 g | INTRAMUSCULAR | Status: DC
Start: 2017-04-21 — End: 2017-04-21

## 2017-04-21 MED ORDER — NORMAL SALINE FLUSH 0.9 % IV SOLN
0.9 % | INTRAVENOUS | Status: DC | PRN
Start: 2017-04-21 — End: 2017-04-21

## 2017-04-21 MED ORDER — FAMOTIDINE 20 MG/2ML IV SOLN
20 MG/2ML | Freq: Two times a day (BID) | INTRAVENOUS | Status: DC
Start: 2017-04-21 — End: 2017-04-21

## 2017-04-21 MED ORDER — BUPROPION HCL ER (XL) 300 MG PO TB24
300 MG | Freq: Every morning | ORAL | Status: DC
Start: 2017-04-21 — End: 2017-04-22
  Administered 2017-04-21 – 2017-04-22 (×2): 300 mg via ORAL

## 2017-04-21 MED ORDER — CLONAZEPAM 1 MG PO TABS
1 MG | Freq: Two times a day (BID) | ORAL | Status: DC | PRN
Start: 2017-04-21 — End: 2017-04-22
  Administered 2017-04-21: 16:00:00 0.5 mg via ORAL

## 2017-04-21 MED ORDER — NORMAL SALINE FLUSH 0.9 % IV SOLN
0.9 | INTRAVENOUS | Status: DC | PRN
Start: 2017-04-21 — End: 2017-04-22

## 2017-04-21 MED ORDER — FENTANYL CITRATE (PF) 100 MCG/2ML IJ SOLN
100 MCG/2ML | INTRAMUSCULAR | Status: DC | PRN
Start: 2017-04-21 — End: 2017-04-21
  Administered 2017-04-21: 13:00:00 50 via INTRAVENOUS
  Administered 2017-04-21: 13:00:00 100 via INTRAVENOUS
  Administered 2017-04-21: 13:00:00 50 via INTRAVENOUS

## 2017-04-21 MED ORDER — LACTATED RINGERS IV SOLN
INTRAVENOUS | Status: DC
Start: 2017-04-21 — End: 2017-04-21
  Administered 2017-04-21: 11:00:00 via INTRAVENOUS

## 2017-04-21 MED ORDER — DEXAMETHASONE SODIUM PHOSPHATE 4 MG/ML IJ SOLN
4 | INTRAMUSCULAR | Status: AC
Start: 2017-04-21 — End: 2017-04-21

## 2017-04-21 MED ORDER — OXYCODONE-ACETAMINOPHEN 5-325 MG PO TABS
5-325 MG | ORAL | Status: DC | PRN
Start: 2017-04-21 — End: 2017-04-22

## 2017-04-21 MED ORDER — DEXAMETHASONE SODIUM PHOSPHATE 4 MG/ML IJ SOLN
4 MG/ML | INTRAMUSCULAR | Status: DC | PRN
Start: 2017-04-21 — End: 2017-04-21
  Administered 2017-04-21: 13:00:00 4 via INTRAVENOUS

## 2017-04-21 MED ORDER — PROPOFOL 200 MG/20ML IV EMUL
200 | INTRAVENOUS | Status: AC
Start: 2017-04-21 — End: 2017-04-21

## 2017-04-21 MED ORDER — BUPROPION HCL ER (XL) 150 MG PO TB24
150 MG | Freq: Every morning | ORAL | Status: DC
Start: 2017-04-21 — End: 2017-04-22
  Administered 2017-04-21 – 2017-04-22 (×2): 150 mg via ORAL

## 2017-04-21 MED ORDER — PROPOFOL 200 MG/20ML IV EMUL
200 MG/20ML | INTRAVENOUS | Status: DC | PRN
Start: 2017-04-21 — End: 2017-04-21
  Administered 2017-04-21: 13:00:00 150 via INTRAVENOUS

## 2017-04-21 MED ORDER — FENTANYL CITRATE (PF) 100 MCG/2ML IJ SOLN
100 MCG/2ML | INTRAMUSCULAR | Status: DC | PRN
Start: 2017-04-21 — End: 2017-04-22
  Administered 2017-04-21 – 2017-04-22 (×5): 25 ug via INTRAVENOUS

## 2017-04-21 MED ORDER — MORPHINE SULFATE 2 MG/ML IJ SOLN
2 MG/ML | INTRAMUSCULAR | Status: DC | PRN
Start: 2017-04-21 — End: 2017-04-21
  Administered 2017-04-21: 16:00:00 4 mg via INTRAVENOUS

## 2017-04-21 MED ORDER — TIZANIDINE HCL 4 MG PO TABS
4 MG | Freq: Every evening | ORAL | Status: DC
Start: 2017-04-21 — End: 2017-04-22
  Administered 2017-04-22: 02:00:00 8 mg via ORAL

## 2017-04-21 MED ORDER — DIPHENHYDRAMINE HCL 50 MG/ML IJ SOLN
50 MG/ML | Freq: Once | INTRAMUSCULAR | Status: DC | PRN
Start: 2017-04-21 — End: 2017-04-21

## 2017-04-21 MED ORDER — PROMETHAZINE HCL 25 MG PO TABS
25 MG | Freq: Four times a day (QID) | ORAL | Status: DC | PRN
Start: 2017-04-21 — End: 2017-04-22

## 2017-04-21 MED ORDER — ONDANSETRON HCL 4 MG/2ML IJ SOLN
4 | INTRAMUSCULAR | Status: AC
Start: 2017-04-21 — End: 2017-04-21

## 2017-04-21 MED FILL — DIPRIVAN 200 MG/20ML IV EMUL: 200 MG/20ML | INTRAVENOUS | Qty: 20

## 2017-04-21 MED FILL — OXYCODONE-ACETAMINOPHEN 5-325 MG PO TABS: 5-325 MG | ORAL | Qty: 2

## 2017-04-21 MED FILL — MIDAZOLAM HCL 2 MG/2ML IJ SOLN: 2 MG/ML | INTRAMUSCULAR | Qty: 2

## 2017-04-21 MED FILL — MORPHINE SULFATE (PF) 2 MG/ML IV SOLN: 2 mg/mL | INTRAVENOUS | Qty: 1

## 2017-04-21 MED FILL — PROMETHAZINE HCL 25 MG/ML IJ SOLN: 25 MG/ML | INTRAMUSCULAR | Qty: 1

## 2017-04-21 MED FILL — CLONAZEPAM 1 MG PO TABS: 1 MG | ORAL | Qty: 1

## 2017-04-21 MED FILL — FENTANYL CITRATE (PF) 100 MCG/2ML IJ SOLN: 100 MCG/2ML | INTRAMUSCULAR | Qty: 2

## 2017-04-21 MED FILL — ONDANSETRON HCL 4 MG/2ML IJ SOLN: 4 MG/2ML | INTRAMUSCULAR | Qty: 2

## 2017-04-21 MED FILL — XYLOCAINE/EPINEPHRINE 1 %-1:100000 IJ SOLN: 1 %-:00000 | INTRAMUSCULAR | Qty: 40

## 2017-04-21 MED FILL — TIZANIDINE HCL 4 MG PO TABS: 4 MG | ORAL | Qty: 2

## 2017-04-21 MED FILL — PANTOPRAZOLE SODIUM 40 MG PO TBEC: 40 MG | ORAL | Qty: 1

## 2017-04-21 MED FILL — BUPROPION HCL ER (XL) 150 MG PO TB24: 150 MG | ORAL | Qty: 1

## 2017-04-21 MED FILL — BRINTELLIX 10 MG PO TABS: 10 MG | ORAL | Qty: 1

## 2017-04-21 MED FILL — MORPHINE SULFATE 2 MG/ML IJ SOLN: 2 mg/mL | INTRAMUSCULAR | Qty: 2

## 2017-04-21 MED FILL — ADDERALL XR 10 MG PO CP24: 10 MG | ORAL | Qty: 2

## 2017-04-21 MED FILL — DEXAMETHASONE SODIUM PHOSPHATE 4 MG/ML IJ SOLN: 4 MG/ML | INTRAMUSCULAR | Qty: 1

## 2017-04-21 MED FILL — BUPROPION HCL ER (XL) 300 MG PO TB24: 300 MG | ORAL | Qty: 1

## 2017-04-21 MED FILL — CEFAZOLIN SODIUM 1 G IJ SOLR: 1 g | INTRAMUSCULAR | Qty: 2000

## 2017-04-21 MED FILL — BUPIVACAINE HCL (PF) 0.25 % IJ SOLN: 0.25 % | INTRAMUSCULAR | Qty: 30

## 2017-04-21 MED FILL — VANCOMYCIN HCL 10 G IV SOLR: 10 g | INTRAVENOUS | Qty: 1250

## 2017-04-21 NOTE — Progress Notes (Signed)
Pharmacy Note  Vancomycin Consult    Linda Gilbert is a 31 y.o. female started on Vancomycin for axilla abscess; consult received from Dr. Eber Jonestaibi to manage therapy.     Patient Active Problem List   Diagnosis   ??? Chronic pain   ??? Carpal tunnel syndrome   ??? Pain in joint, hand   ??? Obesity, Class III, BMI 40-49.9 (morbid obesity) (HCC)   ??? Abscess of axilla, right       Allergies:  Dilaudid [hydromorphone]; Lamictal [lamotrigine]; Tape [adhesive tape]; and Vicodin [hydrocodone-acetaminophen]           Intake/Output Summary (Last 24 hours) at 04/21/17 1157  Last data filed at 04/21/17 1006   Gross per 24 hour   Intake 700 ml   Output 10 ml   Net 690 ml       Culture Date      Source                       Results  pending    Ht Readings from Last 1 Encounters:   04/21/17 5\' 3"  (1.6 m)        Wt Readings from Last 1 Encounters:   04/21/17 199 lb (90.3 kg)         Body mass index is 35.25 kg/m??.    Estimated Creatinine Clearance: 172 mL/min (based on SCr of 0.5 mg/dL).    Goal Trough Level: 15 mcg/mL    Assessment/Plan:  Will initiate vancomycin 1250mg  IV every 12 hours.  Timing of trough level will be determined based on culture results, renal function, and clinical response.     Thank you for the consult.  Will continue to follow.   Linda Gilbert, Pharm.D  [facility]

## 2017-04-21 NOTE — Anesthesia Post-Procedure Evaluation (Signed)
POST- ANESTHESIA EVALUATION       Pt Name: Linda Ageenita M Hickle  MRN: [id number]  Birthdate: [birth year]  Date of evaluation: 04/21/2017  Time:  10:32 AM      BP 126/80    Pulse 74    Temp 98.1 ??F (36.7 ??C)    Resp 13    Ht 5\' 3"  (1.6 m)    Wt 199 lb (90.3 kg)    LMP 04/12/2017    SpO2 100%    BMI 35.25 kg/m??      Consciousness Level  Awake  Cardiopulmonary Status  Stable  Pain Adequately Treated YES  Nausea / Vomiting  NO  Adequate Hydration  YES  Anesthesia Related Complications NONE      Electronically signed by Ileene HutchinsonLu Antania Hoefling, MD on 04/21/2017 at 10:32 AM       Department of Anesthesiology  Postprocedure Note    Patient: Linda Gilbert  MRN: [id number]  Birthdate: [birth year]  Date of evaluation: 04/21/2017  Time:  10:31 AM     Procedure Summary     Date:  04/21/17 Room / Location:  [facility] / [facility]    Anesthesia Start:  0844 Anesthesia Stop:  [id number]    Procedure:  AXILLARY INCISION AND DRAINAGE (Right ) Diagnosis:  (RIGHT AXILLARY ABSCESS  (PAT ON ADMIT PER ANES))    Surgeon:  Gerhard MunchWael M Otaibi, DO Responsible Provider:  Ileene HutchinsonLu Paloma Grange, MD    Anesthesia Type:  general ASA Status:  2          Anesthesia Type: general    Aldrete Phase I: Aldrete Score: 10    Aldrete Phase II:      Last vitals: Reviewed and per EMR flowsheets.       Anesthesia Post Evaluation

## 2017-04-21 NOTE — Op Note (Signed)
Operative Note      PATIENT NAME:  Linda Gilbert               MEDICAL RECORD NO. [id number]                DATE OF PROCEDURE:  04/21/2017  PRIMARY CARE PHYSICIAN:  Jola BabinskiKaleem U Gill, MD  PREOPERATIVE DIAGNOSIS:  Large abscess, right axilla  POSTOPERATIVE DIAGNOSIS:  Large, complex, multiloculated abscess of the right axilla  PROCEDURE PERFORMED:  Incision and drainage of complex, multiloculated abscess of the right axilla  SURGEON:  Velma Agnes, DO, FACS  ANESTHESIA:  Gen.  BLOOD LOSS:  Less than 50 ml.  SPECIMENS:  Fluid and tissue culture  COMPLICATIONS:  None immediately appreciated.    DISCUSSION:  Linda Gilbert is a 31 y.o. year old female who presented with signs and symptoms of right axillary abscess, worsening and enlarging, unresponsive to oral antibiotics.. After history and physical examination was performed potential diagnostic and therapeutic modalities were discussed with the patient. Operative and nonoperative management was discussed. Risks, complications, benefits were reviewed.  She was given the opportunity to ask questions. Once answered an informed consent was obtained.  The patient was brought to the operating room on 04/21/2017.     PROCEDURE:   The patient received preoperative antibiotic, DVT and GI prophylaxis. She was taken to the operating room, placed on the operative table in the supine position. General anesthetic was administered and the patient was intubated. The operative site was prepped and draped in the usual sterile fashion. Time out was called.  An incision was made over the area of greatest fluctuance with drainage of large amount of thick purulent material.  This was cultured.  The area was explored digitally and the patient was noted to have a very large area of complex multiloculated fluid collections.  There was quite a bit of tissue destruction.  At least 300 mL of purulent material was expressed.  All loculations were broken down with blunt finger dissection as well as with a  hemostat.  The tissue destruction extended anteriorly and a counterincision was made in the anterior axillary line to allow for better irrigation and drainage.  Once we felt we had all the loculations broken down the area was copiously irrigated with 1000 mL saline.  Both wounds were then packed with 1 inch iodoform packing.  A bulky dressing was applied.  Fluid and tissue were sent for cultures.  The patient was awakened, extubated, and taken to recovery in stable condition. Instruments, needles, and sponges were counted twice at the end of the procedure and the counts were correct.

## 2017-04-21 NOTE — H&P (Signed)
HISTORY and PHYSICAL  [facility]       NAME:  Linda Gilbert  MRN: [id number]   Date of Birth:  1986/06/17   Date: 04/21/2017   Age: 31 y.o.  Gender: female       COMPLAINT AND PRESENT HISTORY:     Linda Gilbert is 31 y.o.,Caucasian  female, here for AXILLARY INCISION AND DRAINAGE-Right.  Patient has an abscess in the right axillary area.  Pt was seen 4 days ago in the ED with complaints of redness and pain that had progressively got worse. Pt also complained of fever and joint pain. Pt was prescribed antibiotics.   The symptoms started 1 week as a pimple that progressed to large area.   Pt has pain today is reported as greater than 10/10. Pt describes pain as severe.    Pt states that she has had percocet for pain that is not effective for her pain. Pt reports that she has a hx of a prior abscess to the right  Neck area that was extensive to remove.  No present fever or chills.  No recent  trauma.    PAST MEDICAL HISTORY     Past Medical History:   Diagnosis Date   . Bipolar 1 disorder (HCC)    . Constipation    . Depression    . Dizziness    . GERD (gastroesophageal reflux disease)    . Kidney stone    . Migraines    . Morbid obesity with BMI of 45.0-49.9, adult (HCC)    . Nausea     due to migraines and pain related   . Numbness and tingling     hands   . UTI (urinary tract infection)    . Wears glasses          SURGICAL HISTORY       Past Surgical History:   Procedure Laterality Date   . APPENDECTOMY     . CARPAL TUNNEL RELEASE  12/08/13    right hand   . CHOLECYSTECTOMY      stent also in bile duct   . KNEE SURGERY Bilateral     4 surgeries on right knee, 5 on left   . OTHER SURGICAL HISTORY      stent in common bile   . OVARIAN CYST REMOVAL Bilateral    . TONSILLECTOMY     . TUBAL LIGATION Bilateral    . TUMOR REMOVAL Left     mid back   . UPPER GASTROINTESTINAL ENDOSCOPY           SOCIAL HISTORY       Social History     Social History   . Marital status: Single     Spouse name: N/A   . Number of  children: N/A   . Years of education: N/A     Social History Main Topics   . Smoking status: Current Every Day Smoker     Packs/day: 0.75     Years: 12.00     Types: Cigarettes   . Smokeless tobacco: Never Used   . Alcohol use No   . Drug use: No   . Sexual activity: Yes     Other Topics Concern   . None     Social History Narrative   . None           REVIEW OF SYSTEMS      Allergies   Allergen Reactions   . Dilaudid [Hydromorphone]    .  Lamictal [Lamotrigine]      Pt states it "literally made me go psychotic"   . Tape [Adhesive Tape]    . Vicodin [Hydrocodone-Acetaminophen]        No current facility-administered medications on file prior to encounter.      Current Outpatient Prescriptions on File Prior to Encounter   Medication Sig Dispense Refill   . ibuprofen (ADVIL;MOTRIN) 800 MG tablet Take 1 tablet by mouth every 8 hours as needed for Pain 30 tablet 0   . VORTIoxetine HBr (TRINTELLIX) 20 MG TABS tablet Take 10 mg by mouth daily     . ibuprofen (ADVIL;MOTRIN) 200 MG tablet Take 400 mg by mouth every 6 hours as needed for Pain     . buPROPion (WELLBUTRIN XL) 150 MG XL tablet Take 150 mg by mouth every morning Indications: (Pt takes a 300mg  + a 150mg  tab = 450mg  XL in am) (Pt takes a 300mg  + a 150mg  tab = 450mg  XL in am)     . buPROPion (WELLBUTRIN XL) 300 MG XL tablet Take 300 mg by mouth every morning Indications: (Pt takes a 300mg  + a 150mg  tab = 450mg  XL in am) (Pt takes a 300mg  + a 150mg  tab = 450mg  XL in am)     . promethazine (PHENERGAN) 25 MG tablet Take 25 mg by mouth every 6 hours as needed for Nausea     . pantoprazole (PROTONIX) 20 MG tablet Take 2 tablets by mouth daily (Patient taking differently: Take 40 mg by mouth 2 times daily ) 30 tablet 0   . clonazePAM (KLONOPIN) 0.5 MG tablet Take 0.5 mg by mouth 2 times daily as needed for Anxiety.     Marland Kitchen. ibuprofen (ADVIL;MOTRIN) 800 MG tablet Take 1 tablet by mouth every 8 hours as needed for Pain for 20 doses. 20 tablet 0       Negative except for what is  mentioned in the HPI.     GENERAL PHYSICAL EXAM     Vitals: BP (!) 112/56   Pulse 83   Temp 98.6 F (37 C) (Oral)   Resp 16   Ht 5\' 3"  (1.6 m)   Wt 199 lb (90.3 kg)   LMP 04/12/2017   SpO2 100%   BMI 35.25 kg/m  Body mass index is 35.25 kg/m.     GENERAL APPEARANCE:   Linda Gilbert is 31 y.o.,Caucasian  female, mildly obese, nourished, conscious, alert.  Does not appear to be distress or pain at this time.                            SKIN:  Warm, dry, no cyanosis or jaundice.              HEAD:  Normocephalic, atraumatic, no swelling or tenderness.                 EYES:  Pupils equal, reactive to light.           EARS:  No discharge, no marked hearing loss.               NOSE:  No rhinorrhea, epistaxis or septal deformity.                 THROAT:  Not congested. No ulceration bleeding or discharge.                  NECK:  No stiffness, trachea central.  No palpable  masses or L.N.                 CHEST:  Symmetrical and equal on expansion.                 HEART:  RRR S1 > S2. No audible murmurs or gallops.                 LUNGS:  Equal on expansion, normal breath sounds.  No adventitious sounds.           ABDOMEN:  Mildly obese.  Soft on palpation.  No localized tenderness.  No guarding or rigidity. No palpable organomegaly.              LYMPHATICS:  No palpable cervical lymphadenopathy.     LOCOMOTOR, BACK AND SPINE:  No tenderness or deformities.                 EXTREMITIES:  Symmetrical, no pedal edema.  Homan's sign negative.  No discoloration or ulcerations. Right axillary area erythematous, painful to touch, indurated and raised extending around to the back.     NEUROLOGIC:  The patient is conscious, alert, oriented, No apparent focal sensory or motor deficits.                      PROVISIONAL DIAGNOSES / SURGERY:      RIGHT AXILLARY ABSCESS  AXILLARY INCISION AND DRAINAGE Right      Patient Active Problem List    Diagnosis Date Noted   . Chronic pain 11/11/2013   . Carpal tunnel syndrome 11/11/2013    . Pain in joint, hand 11/11/2013   . Obesity, Class III, BMI 40-49.9 (morbid obesity) (HCC) 11/11/2013           Sherronda Sweigert K Kacie Huxtable, APRN - CNP on 04/21/2017 at 7:31 AM

## 2017-04-21 NOTE — Discharge Instructions (Signed)
???   Good nutrition is important when healing from an illness, injury, or surgery.  Follow any nutrition recommendations given to you during your hospital stay.   ??? If you were given an oral nutrition supplement while in the hospital, continue to take this supplement at home.  You can take it with meals, in-between meals, and/or before bedtime. These supplements can be purchased at most local grocery stores, pharmacies, and chain super-stores.   ??? If you have any questions about your diet or nutrition, call the hospital and ask for the dietitian.    General Diet

## 2017-04-21 NOTE — Care Coordination-Inpatient (Signed)
CASE MANAGEMENT NOTE:    Admission Date:  04/21/2017 Linda Gilbert is a 31 y.o.  female    Admitted for : Abscess of axilla, right [L02.411]    Met with:  Patient and Family    PCP:  Dr Gordy Levan                                Insurance:  [employer]      Current Residence/ Living Arrangements:  independently at home             Current Services PTA:  No    Is patient agreeable to VNS: Yes    Freedom of choice provided: Yes         VNS chosen:  Yes- Writer will send referral to [place]    DME:  Wheel chair    Home Oxygen: No    Nebulizer: No    Supplier: N/A    Potential Assistance Needed: Yes    SNF needed: No    Pharmacy:  [facility] in [place]       Does Patient want to use MEDS to BEDS? No    Family Members/Caregivers that pt would like involved in their care:    Yes    If yes, list name here:  Mother [name]    Transportation Provider:  Patient and Family                      Discharge Plan:  04/21/2017 [employer]; From double story home with child- Stated Independent and drives; DME- Wheel Chair; Agreeable to VNS [place] for [place]- will send referral today; Had I &D of abscess in right axilla; IV vanco; COC NEEDS SIGFNED/COMPLETED//[facility]            Electronically signed by: Thurston Pounds, RN on 04/21/2017 at 4:25 PM

## 2017-04-21 NOTE — Discharge Instructions (Addendum)
Activity as tolerated     Pack wound loosely with plain gauze BID

## 2017-04-21 NOTE — Progress Notes (Signed)
ADMISSION NOTE       Patient admitted to room  2051.      Time of admit:  1030    Admit from:  PACU    Reason for admission:  Right axilla abscess I&D    Where patient has been residing for the last 24 hrs:  Private residence     Has the patient been admitted to any facility in the last 4 weeks, which one:  no    Family at bedside:  Yes     Patient is currently resting in bed, vitals obtained, no distress noted. Patient has been oriented to room, educated on how to use call light, and to call for assistance prior to getting up.  Bed in lowest and locked position.  2 siderails up for safety.  Call light within reach.

## 2017-04-21 NOTE — Care Coordination-Inpatient (Signed)
[facility]  DVT Prophylaxis and Vaccine Status  Work Office managerList  Mandatory for all patients      Patient must be on both Chemical prophylaxis and Mechanical prophylaxis. If chemical/mechanical prophylaxis is not ordered, the physician must document a reason for not using prophylaxis     Chemical Prophylaxis  Is patient on chemical prophylaxis: Yes  If no chemical prophylaxis Is a order in for No Chemical VTE prophylaxisNo  If no was the physician notified not applicable      Mechanical Prophylaxis  Is patient on mechanical prophylaxis, intermittent pneumatic compression device: Yes  If no was the physician notified not applicable        Pneumonia Vaccine  Vaccine indicated:  Not indicated  If indicated was the vaccine given: not applicable    Influenza Vaccine (applicable from October through March):  Vaccine indicated: Not indicated  If indicated was the vaccine given: not applicable    Patient Education  Education completed on DVT prophylaxis: yes     [facility]  [facility]  [employer] Measure Compliance  Work List    24 hours from Anesthesia End Time is: [phone]    VTE must be administered and applied within 24 hours of anesthesia end time. yes  Patient should have both Chemical and Mechanical  Prophylaxis    Beta Blocker  It is recommended that patients take their beta blocker the day of surgery.  Was the patient on a beta blocker prior to surgery? No  Did the physician reorder the beta blocker for post op medications? not applicable  If the physician did not reorder the beta blocker was he notified not applicable      Prophylactic Post Op Antibiotics  Is the antibiotic scheduled to end within 24 hours of Anesthesia end time? Yes    Foley  Does patient have a foley? No  It is recommended that the foley be discontinued by POD#2. Place a sticky note to the physician to remind him/her that the foley should be out by the following date: n/a

## 2017-04-21 NOTE — Anesthesia Pre-Procedure Evaluation (Signed)
Department of Anesthesiology  Preprocedure Note       Name:  Linda Gilbert   Age:  31 y.o.  DOB:  Jun 17, 1986                                          MRN:  [id number]         Date:  04/21/2017      Surgeon: Moishe Spice):  Gerhard Munch, DO    Procedure: Procedure(s):  AXILLARY INCISION AND DRAINAGE    Medications prior to admission:   Prior to Admission medications    Medication Sig Start Date End Date Taking? Authorizing Provider   oxyCODONE-acetaminophen (PERCOCET) 10-325 MG per tablet Take 1 tablet by mouth.. 04/17/17  Yes Historical Provider, MD   cephALEXin (KEFLEX) 500 MG capsule Take 500 mg by mouth 04/17/17  Yes Historical Provider, MD   amphetamine-dextroamphetamine (ADDERALL XR) 20 MG extended release capsule Take 20 mg by mouth.. 04/09/17  Yes Historical Provider, MD   Cholecalciferol (VITAMIN D3) 50000 units CAPS Take 50,000 Units by mouth 03/12/17  Yes Historical Provider, MD   sulfamethoxazole-trimethoprim (BACTRIM DS;SEPTRA DS) 800-160 MG per tablet Take 1 tablet by mouth 04/17/17  Yes Historical Provider, MD   tiZANidine (ZANAFLEX) 4 MG tablet Take 8 mg by mouth   Yes Historical Provider, MD   ibuprofen (ADVIL;MOTRIN) 800 MG tablet Take 1 tablet by mouth every 8 hours as needed for Pain 03/07/17  Yes Dorothe Pea, MD   VORTIoxetine HBr (TRINTELLIX) 20 MG TABS tablet Take 10 mg by mouth daily   Yes Historical Provider, MD   ibuprofen (ADVIL;MOTRIN) 200 MG tablet Take 400 mg by mouth every 6 hours as needed for Pain   Yes Historical Provider, MD   buPROPion (WELLBUTRIN XL) 150 MG XL tablet Take 150 mg by mouth every morning Indications: (Pt takes a 300mg  + a 150mg  tab = 450mg  XL in am) (Pt takes a 300mg  + a 150mg  tab = 450mg  XL in am)   Yes Historical Provider, MD   buPROPion (WELLBUTRIN XL) 300 MG XL tablet Take 300 mg by mouth every morning Indications: (Pt takes a 300mg  + a 150mg  tab = 450mg  XL in am) (Pt takes a 300mg  + a 150mg  tab = 450mg  XL in am)   Yes Historical Provider, MD   promethazine (PHENERGAN) 25 MG  tablet Take 25 mg by mouth every 6 hours as needed for Nausea   Yes Historical Provider, MD   pantoprazole (PROTONIX) 20 MG tablet Take 2 tablets by mouth daily  Patient taking differently: Take 40 mg by mouth 2 times daily  06/13/15  Yes Christella Noa, MD   clonazePAM (KLONOPIN) 0.5 MG tablet Take 0.5 mg by mouth 2 times daily as needed for Anxiety.   Yes Historical Provider, MD   ibuprofen (ADVIL;MOTRIN) 800 MG tablet Take 1 tablet by mouth every 8 hours as needed for Pain for 20 doses. 05/12/13 04/17/17  Salomon Mast, MD       Current medications:    Current Facility-Administered Medications   Medication Dose Route Frequency Provider Last Rate Last Dose   . lactated ringers infusion   Intravenous Continuous Ileene Hutchinson, MD 125 mL/hr at 04/21/17 0725     . sodium chloride flush 0.9 % injection 10 mL  10 mL Intravenous 2 times per day Ileene Hutchinson, MD       .  sodium chloride flush 0.9 % injection 10 mL  10 mL Intravenous PRN Ileene Hutchinson, MD           Allergies:    Allergies   Allergen Reactions   . Dilaudid [Hydromorphone]    . Lamictal [Lamotrigine]      Pt states it "literally made me go psychotic"   . Tape [Adhesive Tape]    . Vicodin [Hydrocodone-Acetaminophen]        Problem List:    Patient Active Problem List   Diagnosis Code   . Chronic pain G89.29   . Carpal tunnel syndrome G56.00   . Pain in joint, hand M25.549   . 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 Bipolar 1 disorder (HCC)    . Constipation    . Depression    . Dizziness    . GERD (gastroesophageal reflux disease)    . Kidney stone    . Migraines    . Morbid obesity with BMI of 45.0-49.9, adult (HCC)    . Nausea     due to migraines and pain related   . Numbness and tingling     hands   . UTI (urinary tract infection)    . Wears glasses        Past Surgical History:        Procedure Laterality Date   . APPENDECTOMY     . CARPAL TUNNEL RELEASE  12/08/13    right hand   . CHOLECYSTECTOMY       stent also in bile duct   . KNEE SURGERY Bilateral     4 surgeries on right knee, 5 on left   . OTHER SURGICAL HISTORY      stent in common bile   . OVARIAN CYST REMOVAL Bilateral    . TONSILLECTOMY     . TUBAL LIGATION Bilateral    . TUMOR REMOVAL Left     mid back   . UPPER GASTROINTESTINAL ENDOSCOPY         Social History:    Social History   Substance Use Topics   . Smoking status: Current Every Day Smoker     Packs/day: 0.75     Years: 12.00     Types: Cigarettes   . Smokeless tobacco: Never Used   . Alcohol use No                                Ready to quit: Not Answered  Counseling given: Not Answered      Vital Signs (Current):   Vitals:    04/21/17 0706   BP: (!) 112/56   Pulse: 83   Resp: 16   Temp: 98.6 F (37 C)   TempSrc: Oral   SpO2: 100%   Weight: 199 lb (90.3 kg)   Height: 5\' 3"  (1.6 m)                                              BP Readings from Last 3 Encounters:   04/21/17 (!) 112/56   04/17/17 132/79   03/07/17 118/64       NPO Status: Time of last liquid consumption: 2345  Time of last solid consumption: 2100                        Date of last liquid consumption: 04/20/17                        Date of last solid food consumption: 04/20/17    BMI:   Wt Readings from Last 3 Encounters:   04/21/17 199 lb (90.3 kg)   04/17/17 199 lb (90.3 kg)   03/06/17 198 lb (89.8 kg)     Body mass index is 35.25 kg/m.    CBC:   Lab Results   Component Value Date    WBC 12.3 03/07/2017    RBC 4.07 03/07/2017    RBC 4.15 11/11/2010    HGB 12.2 03/07/2017    HCT 36.9 03/07/2017    MCV 90.5 03/07/2017    RDW 13.1 03/07/2017    PLT 255 03/07/2017    PLT 293 11/11/2010       CMP:   Lab Results   Component Value Date    NA 135 03/07/2017    K 3.4 03/07/2017    CL 100 03/07/2017    CO2 20 03/07/2017    BUN 7 03/07/2017    CREATININE 0.50 03/07/2017    GFRAA >60 03/07/2017    LABGLOM >60 03/07/2017    GLUCOSE 123 03/07/2017    GLUCOSE 105 11/11/2010    PROT 7.9 06/13/2015    CALCIUM 8.7  03/07/2017    BILITOT 0.39 06/13/2015    ALKPHOS 94 06/13/2015    AST 42 06/13/2015    ALT 40 06/13/2015       POC Tests: No results for input(s): POCGLU, POCNA, POCK, POCCL, POCBUN, POCHEMO, POCHCT in the last 72 hours.    Coags:   Lab Results   Component Value Date    PROTIME 10.3 03/16/2014    INR 1.0 03/16/2014    APTT 26.5 03/16/2014       HCG (If Applicable):   Lab Results   Component Value Date    PREGTESTUR Negative 06/12/2015    HCG NEGATIVE 04/21/2017        ABGs: No results found for: PHART, PO2ART, PCO2ART, HCO3ART, BEART, O2SATART     Type & Screen (If Applicable):  No results found for: LABABO, LABRH    Anesthesia Evaluation  Patient summary reviewed and Nursing notes reviewed no history of anesthetic complications:   Airway: Mallampati: II  TM distance: >3 FB   Neck ROM: full  Mouth opening: > = 3 FB Dental:          Pulmonary:normal exam  breath sounds clear to auscultation  (+) current smoker                           Cardiovascular:Negative CV ROS            Rhythm: regular  Rate: normal                    Neuro/Psych:   (+) neuromuscular disease:, headaches: migraine headaches, psychiatric history:depression/anxiety             GI/Hepatic/Renal:   (+) GERD: no interval change, renal disease: kidney stones and no interval change,           Endo/Other:  ROS comment: obesity Abdominal:           Vascular: negative vascular ROS.                                       Anesthesia Plan      general     ASA 2       Induction: intravenous.    MIPS: Postoperative opioids intended and Prophylactic antiemetics administered.  Anesthetic plan and risks discussed with patient.      Plan discussed with CRNA.                  Ileene HutchinsonLu Katiejo Gilroy, MD   04/21/2017

## 2017-04-21 NOTE — Progress Notes (Signed)
Urine pregnancy test negative. Results faxed to lab

## 2017-04-22 LAB — CBC WITH AUTO DIFFERENTIAL
Absolute Eos #: 0.2 10*3/uL (ref 0.0–0.4)
Absolute Lymph #: 2.6 10*3/uL (ref 1.0–4.8)
Absolute Mono #: 0.6 10*3/uL (ref 0.1–1.3)
Basophils Absolute: 0 10*3/uL (ref 0.0–0.2)
Basophils: 0 % (ref 0–2)
Eosinophils %: 3 % (ref 0–4)
Hematocrit: 29 % — ABNORMAL LOW (ref 36–46)
Hemoglobin: 9.7 g/dL — ABNORMAL LOW (ref 12.0–16.0)
Lymphocytes: 28 % (ref 24–44)
MCH: 30.3 pg (ref 26–34)
MCHC: 33.4 g/dL (ref 31–37)
MCV: 90.7 fL (ref 80–100)
MPV: 9.4 fL (ref 6.0–12.0)
Monocytes: 7 % (ref 1–7)
Platelets: 228 10*3/uL (ref 150–450)
RBC: 3.19 m/uL — ABNORMAL LOW (ref 4.0–5.2)
RDW: 12.8 % (ref 11.5–14.9)
Seg Neutrophils: 62 % (ref 36–66)
Segs Absolute: 5.6 10*3/uL (ref 1.3–9.1)
WBC: 9.1 10*3/uL (ref 3.5–11.0)

## 2017-04-22 LAB — BASIC METABOLIC PANEL W/ REFLEX TO MG FOR LOW K
Anion Gap: 13 mmol/L (ref 9–17)
BUN: 4 mg/dL — ABNORMAL LOW (ref 6–20)
CO2: 26 mmol/L (ref 20–31)
Calcium: 8.8 mg/dL (ref 8.6–10.4)
Chloride: 100 mmol/L (ref 98–107)
Creatinine: 0.45 mg/dL — ABNORMAL LOW (ref 0.50–0.90)
GFR African American: >60 mL/min (ref >60)
GFR Non-African American: >60 mL/min (ref >60)
Glucose: 161 mg/dL — ABNORMAL HIGH (ref 70–99)
Potassium: 3.5 mmol/L — ABNORMAL LOW (ref 3.7–5.3)
Sodium: 139 mmol/L (ref 135–144)

## 2017-04-22 LAB — SURGICAL PATHOLOGY

## 2017-04-22 LAB — MAGNESIUM: Magnesium: 2.3 mg/dL (ref 1.6–2.6)

## 2017-04-22 MED ORDER — PROBIOTIC ACIDOPHILUS PO TABS
ORAL_TABLET | Freq: Every day | ORAL | 0 refills | Status: AC
Start: 2017-04-22 — End: ?

## 2017-04-22 MED ORDER — POTASSIUM CHLORIDE 10 MEQ/100ML IV SOLN
10 MEQ/0ML | INTRAVENOUS | Status: DC | PRN
Start: 2017-04-22 — End: 2017-04-22

## 2017-04-22 MED ORDER — POTASSIUM CHLORIDE CRYS ER 20 MEQ PO TBCR
20 MEQ | ORAL | Status: DC | PRN
Start: 2017-04-22 — End: 2017-04-22
  Administered 2017-04-22: 14:00:00 40 meq via ORAL

## 2017-04-22 MED ORDER — DOXYCYCLINE HYCLATE 100 MG PO TABS
100 MG | ORAL_TABLET | Freq: Two times a day (BID) | ORAL | 0 refills | Status: AC
Start: 2017-04-22 — End: 2017-05-02

## 2017-04-22 MED ORDER — POTASSIUM CHLORIDE 20 MEQ/15ML (10%) PO SOLN
20 MEQ/15ML (10%) | ORAL | Status: DC | PRN
Start: 2017-04-22 — End: 2017-04-22

## 2017-04-22 MED FILL — OXYCODONE-ACETAMINOPHEN 5-325 MG PO TABS: 5-325 MG | ORAL | Qty: 2

## 2017-04-22 MED FILL — FENTANYL CITRATE (PF) 100 MCG/2ML IJ SOLN: 100 MCG/2ML | INTRAMUSCULAR | Qty: 2

## 2017-04-22 MED FILL — PANTOPRAZOLE SODIUM 40 MG PO TBEC: 40 MG | ORAL | Qty: 1

## 2017-04-22 MED FILL — BUPROPION HCL ER (XL) 150 MG PO TB24: 150 MG | ORAL | Qty: 1

## 2017-04-22 MED FILL — ONDANSETRON HCL 4 MG/2ML IJ SOLN: 4 MG/2ML | INTRAMUSCULAR | Qty: 2

## 2017-04-22 MED FILL — VANCOMYCIN HCL 1000 MG IV SOLR: 1000 MG | INTRAVENOUS | Qty: 1250

## 2017-04-22 MED FILL — BUPROPION HCL ER (XL) 300 MG PO TB24: 300 MG | ORAL | Qty: 1

## 2017-04-22 MED FILL — BRINTELLIX 10 MG PO TABS: 10 MG | ORAL | Qty: 1

## 2017-04-22 MED FILL — LOVENOX 30 MG/0.3ML SC SOLN: 30 MG/0.3ML | SUBCUTANEOUS | Qty: 0.3

## 2017-04-22 MED FILL — POTASSIUM CHLORIDE CRYS ER 20 MEQ PO TBCR: 20 MEQ | ORAL | Qty: 2

## 2017-04-22 MED FILL — ADDERALL XR 10 MG PO CP24: 10 MG | ORAL | Qty: 2

## 2017-04-22 MED FILL — VANCOMYCIN HCL 10 G IV SOLR: 10 g | INTRAVENOUS | Qty: 1250

## 2017-04-22 NOTE — Progress Notes (Signed)
General Surgery Progress Note            PATIENT NAME: Linda Gilbert     TODAY'S DATE: 04/22/2017, 10:11 AM    SUBJECTIVE:    Pt  Seen and examined. Doing better.     OBJECTIVE:   VITALS:  BP 121/78    Pulse 63    Temp 97.3 ??F (36.3 ??C) (Oral)    Resp 20    Ht 5\' 3"  (1.6 m)    Wt 199 lb (90.3 kg)    LMP 04/12/2017    SpO2 100%    BMI 35.25 kg/m??      INTAKE/OUTPUT:      Intake/Output Summary (Last 24 hours) at 04/22/17 1011  Last data filed at 04/22/17 0708   Gross per 24 hour   Intake          1946.25 ml   Output             2300 ml   Net          -353.75 ml                 CONSTITUTIONAL:  awake and alert.  no apparent distress  HEART:   Regular   LUNGS:   Clear   ABDOMEN:   Abdomen soft, non-tender, non-distended  EXTREMITIES:   No edema. Right axilla wound stable    Data:  CBC with Differential:    Lab Results   Component Value Date    WBC 9.1 04/22/2017    RBC 3.19 04/22/2017    RBC 4.15 11/11/2010    HGB 9.7 04/22/2017    HCT 29.0 04/22/2017    PLT 228 04/22/2017    PLT 293 11/11/2010    MCV 90.7 04/22/2017    MCH 30.3 04/22/2017    MCHC 33.4 04/22/2017    RDW 12.8 04/22/2017    LYMPHOPCT 28 04/22/2017    MONOPCT 7 04/22/2017    BASOPCT 0 04/22/2017    MONOSABS 0.60 04/22/2017    LYMPHSABS 2.60 04/22/2017    EOSABS 0.20 04/22/2017    BASOSABS 0.00 04/22/2017    DIFFTYPE NOT REPORTED 04/22/2017     BMP:    Lab Results   Component Value Date    NA 139 04/22/2017    K 3.5 04/22/2017    CL 100 04/22/2017    CO2 26 04/22/2017    BUN 4 04/22/2017    LABALBU 4.5 06/13/2015    CREATININE 0.45 04/22/2017    CALCIUM 8.8 04/22/2017    GFRAA >60 04/22/2017    LABGLOM >60 04/22/2017    GLUCOSE 161 04/22/2017    GLUCOSE 105 11/11/2010         ASSESSMENT     Active Problems:    Abscess of axilla, right  Resolved Problems:    * No resolved hospital problems. *  S/P I&D right axilla abscess    Plan  1. Ok to DC home  2. Doxycycline  3. Outpatient follow up in 3 weeks

## 2017-04-22 NOTE — Addendum Note (Signed)
Addendum  created 04/22/17 0934 by Ileene HutchinsonLu Love Chowning, MD    Sign clinical note

## 2017-04-22 NOTE — Progress Notes (Signed)
Patient has all belongings bagged, all medications returned from nurse server to pharmacy.

## 2017-04-22 NOTE — Progress Notes (Signed)
Patient was seen and examined, doing well, VSS, no anesthetic complications

## 2017-04-22 NOTE — Discharge Instructions (Signed)
Continuity of Care Form    Patient Name: Linda Gilbert   DOB:  January 20, 1986  MRN:  [id number]    Admit date:  04/21/2017  Discharge date:  04/22/17    Code Status Order: Full Code   Advance Directives: No    Admitting Physician:  Gerhard MunchWael M Otaibi, DO  PCP: Jola BabinskiKaleem U Gill, MD    Discharging Nurse:   Discharging Hospital Unit/Room#: 2051/2051-01  Discharging Unit Phone Number: [phone]    Emergency Contact:        Past Surgical History:  Past Surgical History:   Procedure Laterality Date   . APPENDECTOMY     . CARPAL TUNNEL RELEASE  12/08/13    right hand   . CHOLECYSTECTOMY      stent also in bile duct   . KNEE SURGERY Bilateral     4 surgeries on right knee, 5 on left   . OTHER SURGICAL HISTORY      stent in common bile   . OVARIAN CYST REMOVAL Bilateral    . PR DRAIN SKIN ABSCESS COMPLIC Right 04/21/2017    AXILLARY INCISION AND DRAINAGE performed by Gerhard [facility]unchWael M Otaibi[facility]   . TONSILLECTOMY     . TUBAL LIGATION Bilateral    . TUMOR REMOVAL Left     mid back   . UPPER GASTROINTESTINAL ENDOSCOPY         Immunization History:     There is no immunization history on file for this patient.    Active Problems:  Patient Active Problem List   Diagnosis Code   . Chronic pain G89.29   . Carpal tunnel syndrome G56.00   . Pain in joint, hand M25.549   . 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 Abscess of axilla, right L02.411       Isolation/Infection:   Isolation          No Isolation            Nurse Assessment:  Last Vital Signs: BP 111/71   Pulse 80   Temp 98.6 F (37 C) (Oral)   Resp 14   Ht 5\' 3"  (1.6 m)   Wt 199 lb (90.3 kg)   LMP 04/12/2017   SpO2 98%   BMI 35.25 kg/m     Last documented pain score (0-10 scale): Pain Level: 9  Last Weight:   Wt Readings from Last 1 Encounters:   04/21/17 199 lb (90.3 kg)     Mental Status:  oriented and alert     IV Access:  - None    Nursing Mobility/ADLs:  Walking   Independent  Transfer  Independent  Bathing  Independent  Dressing   Independent  Toileting  Independent  Feeding  Independent  Med Admin  Independent  Med Delivery   whole    Wound Care Documentation and Therapy:  Incision 04/21/17 Axilla Right (Active)   Wound Assessment Intact 04/21/2017 10:00 AM   Closure UTA 04/21/2017 10:00 AM   Dressing/Treatment Dry dressing 04/21/2017 10:00 AM   Dressing Status Intact;New drainage 04/21/2017 10:00 AM   Number of days: 0       Incision 04/21/17 Axilla Right (Active)   Drainage Amount Moderate 04/21/2017 10:10 AM   Drainage Description Other (Comment) 04/21/2017 10:10 AM   Dressing Status Intact 04/21/2017 10:10 AM   Number of days: 0        Elimination:  Continence:    Bowel: Yes   Bladder:  Yes  Urinary Catheter: None   Colostomy/Ileostomy/Ileal Conduit: No           Intake/Output Summary (Last 24 hours) at 04/21/17 1148  Last data filed at 04/21/17 1006   Gross per 24 hour   Intake              700 ml   Output               10 ml   Net              690 ml     No intake/output data recorded.    Safety Concerns:     None    Impairments/Disabilities:      None    Nutrition Therapy:  Current Nutrition Therapy:   - Oral Diet:  General    Routes of Feeding: Oral  Liquids: No Restrictions  Daily Fluid Restriction: no  Last Modified Barium Swallow with Video (Video Swallowing Test): not done    Treatments at the Time of Hospital Discharge:   Respiratory Treatments: no see MAR  Oxygen Therapy:  is not on home oxygen therapy.  Ventilator:    - No ventilator support    Rehab Therapies: Physical Therapy and Occupational Therapy  Weight Bearing Status/Restrictions: No weight bearing restirctions  Other Medical Equipment (for information only, NOT a DME order):  none  Other Treatments: skilled nursing assessments, right axilla I&D on 04/21/17, dressing changes: right axilla dressing change two times daily with 1 inch plain packing, fluffs, and ABD.    Patient's personal belongings (please select all that are sent with patient):  Glasses    RN SIGNATURE:   Electronically signed by France Ravens, RN on 04/21/17 at 12:33 PM    CASE MANAGEMENT/SOCIAL WORK SECTION    Inpatient Status Date: obs    Readmission Risk Assessment Score:  Readmission Risk              Risk of Unplanned Readmission:        7             Discharging to Facility/ Agency   [facility]  Phone [phone]  Fax [phone]      Case Manager/Social Worker signature: {Esignature:[id number]}    PHYSICIAN SECTION    Prognosis: Good    Condition at Discharge: Stable    Rehab Potential (if transferring to Rehab): Good    Recommended Labs or Other Treatments After Discharge: none     Physician Certification: I certify the above information and transfer of Linda Gilbert  is necessary for the continuing treatment of the diagnosis listed and that she requires Home Care for greater 30 days.     Update Admission H&P: No change in H&P    PHYSICIAN SIGNATURE:  Electronically signed by Gerhard Munch, DO on 04/22/17 at 10:15 AM

## 2017-04-22 NOTE — Progress Notes (Signed)
Patient's dressing/packing was changed.  Patient was also provided with dressing supplies to take home.    .Electronically signed by Leeroy Chaassandra M Burton, RN on 04/22/2017 at 11:27 AM

## 2017-04-22 NOTE — Care Coordination-Inpatient (Signed)
Faxed COC and d/c med list to [facility].    [facility] and spoke with Hailey.  Notified her of what was faxed, and that pt will be d/c'ed home today.    Electronically signed by Kerry HoughAmanda L Laythan Hayter, RN on 04/22/2017 at 10:56 AM

## 2017-04-22 NOTE — Progress Notes (Signed)
Rounded bedside with Dr Eber Jonestaibi.  Change dressings: packed loosly withplain 1" gauze twice daily.  Ok to DC on doxy  .Electronically signed by Leeroy Chaassandra M Burton, RN on 04/22/2017 at 11:23 AM

## 2017-04-22 NOTE — Consults (Signed)
HOSPITALIST CONSULT NOTE      Name: Linda Gilbert  MRN: [id number]     Acct: [id number]  Room:  [id number]    Admit Date: 04/21/2017  PCP: Jola Babinski, MD      Chief Complaint:     No chief complaint on file.      History Obtained From:     chart review and the patient.    History of Present Illness:      Linda Gilbert is a  31 y.o.  female who presents with No chief complaint on file.  Patient was admitted to the hospital follow-up right axillary abscess that failed outpatient treatment with oral antibiotics.  Onset was about 1-2 weeks ago that the like and little pimple and rapidly got worse. Associated symptoms include pain, redness, fever, chills, and warmth.  Denies purulent discharge, chest pain, shortness of breath, vomiting, diarrhea, or palpitations.  Was being treated outpatient with Keflex and Bactrim, but did not respond to treatment.  So was admitted to the hospital by General surgery for I and D and IV antibiotics.   she was treated with IV Vanco and had I and D done yesterday 04/21/2017 by Dr. Eber Willis.  Pain significantly improved postoperatively per patient. states she feels significantly better  At this time.    Past Medical History:     Past Medical History:   Diagnosis Date   ??? Bipolar 1 disorder (HCC)    ??? Constipation    ??? Depression    ??? Dizziness    ??? GERD (gastroesophageal reflux disease)    ??? Kidney stone    ??? Migraines    ??? Morbid obesity with BMI of 45.0-49.9, adult (HCC)    ??? Nausea     due to migraines and pain related   ??? Numbness and tingling     hands   ??? UTI (urinary tract infection)    ??? Wears glasses         Past Surgical History:     Past Surgical History:   Procedure Laterality Date   ??? APPENDECTOMY     ??? CARPAL TUNNEL RELEASE  12/08/13    right hand   ??? CHOLECYSTECTOMY      stent also in bile duct   ??? KNEE SURGERY Bilateral     4 surgeries on right knee, 5 on left   ??? OTHER SURGICAL HISTORY      stent in common bile   ??? OVARIAN CYST REMOVAL Bilateral    ??? PR DRAIN SKIN ABSCESS  COMPLIC Right 04/21/2017    AXILLARY INCISION AND DRAINAGE performed by Gerhard Munch, DO at [facility] OR   ??? TONSILLECTOMY     ??? TUBAL LIGATION Bilateral    ??? TUMOR REMOVAL Left     mid back   ??? UPPER GASTROINTESTINAL ENDOSCOPY          Medications Prior to Admission:       Prior to Admission medications    Medication Sig Start Date End Date Taking? Authorizing Provider   doxycycline hyclate (VIBRA-TABS) 100 MG tablet Take 1 tablet by mouth 2 times daily for 10 days 04/22/17 05/02/17 Yes Wael M Otaibi, DO   Lactobacillus (PROBIOTIC ACIDOPHILUS) TABS Take 1 tablet by mouth daily 04/22/17  Yes Wael M Otaibi, DO   oxyCODONE-acetaminophen (PERCOCET) 10-325 MG per tablet Take 1 tablet by mouth.. 04/17/17  Yes Historical Provider, MD   amphetamine-dextroamphetamine (ADDERALL XR) 20 MG extended release capsule Take 20 mg  by mouth.. 04/09/17  Yes Historical Provider, MD   Cholecalciferol (VITAMIN D3) 50000 units CAPS Take 50,000 Units by mouth 03/12/17  Yes Historical Provider, MD   tiZANidine (ZANAFLEX) 4 MG tablet Take 8 mg by mouth   Yes Historical Provider, MD   ibuprofen (ADVIL;MOTRIN) 800 MG tablet Take 1 tablet by mouth every 8 hours as needed for Pain 03/07/17  Yes Dorothe Pea, MD   VORTIoxetine HBr (TRINTELLIX) 20 MG TABS tablet Take 10 mg by mouth daily   Yes Historical Provider, MD   ibuprofen (ADVIL;MOTRIN) 200 MG tablet Take 400 mg by mouth every 6 hours as needed for Pain   Yes Historical Provider, MD   buPROPion (WELLBUTRIN XL) 150 MG XL tablet Take 150 mg by mouth every morning Indications: (Pt takes a 300mg  + a 150mg  tab = 450mg  XL in am) (Pt takes a 300mg  + a 150mg  tab = 450mg  XL in am)   Yes Historical Provider, MD   buPROPion (WELLBUTRIN XL) 300 MG XL tablet Take 300 mg by mouth every morning Indications: (Pt takes a 300mg  + a 150mg  tab = 450mg  XL in am) (Pt takes a 300mg  + a 150mg  tab = 450mg  XL in am)   Yes Historical Provider, MD   promethazine (PHENERGAN) 25 MG tablet Take 25 mg by mouth every 6 hours as needed  for Nausea   Yes Historical Provider, MD   pantoprazole (PROTONIX) 20 MG tablet Take 2 tablets by mouth daily  Patient taking differently: Take 40 mg by mouth 2 times daily  06/13/15  Yes Christella Noa, MD   clonazePAM (KLONOPIN) 0.5 MG tablet Take 0.5 mg by mouth 2 times daily as needed for Anxiety.   Yes Historical Provider, MD   ibuprofen (ADVIL;MOTRIN) 800 MG tablet Take 1 tablet by mouth every 8 hours as needed for Pain for 20 doses. 05/12/13 04/17/17  Salomon Mast, MD        Allergies:       Dilaudid [hydromorphone]; Lamictal [lamotrigine]; Tape [place] Organ tape]; and Vicodin [hydrocodone-acetaminophen]    Social History:     Tobacco:    reports that she has been smoking Cigarettes.  She has a 9.00 pack-year smoking history. She has never used smokeless tobacco.  Alcohol:      reports that she does not drink alcohol.  Drug Use:  reports that she does not use drugs.    Family History:     Family History   Problem Relation Age of Onset   ??? Liver Cancer Maternal Aunt      died at 31 yrs old   ??? Lung Cancer Maternal Grandfather    ??? Cancer Paternal Grandfather    ??? Kidney Disease Maternal Aunt    ??? Thyroid Disease Mother        Review of Systems:     All 10 systems reviewed and found negative except as noted above    Code Status:  Full Code    Physical Exam:     Vitals:  BP 121/78    Pulse 63    Temp 97.3 ??F (36.3 ??C) (Oral)    Resp 20    Ht 5\' 3"  (1.6 m)    Wt 199 lb (90.3 kg)    LMP 04/12/2017    SpO2 100%    BMI 35.25 kg/m??   Temp (24hrs), Avg:97.9 ??F (36.6 ??C), Min:97.3 ??F (36.3 ??C), Max:98.3 ??F (36.8 ??C)      Physical exam  GEN: AAOx3, NAD, obese  HEENT: NC/AT, mucus membrane moist, EOMI, PERRLA, no scleral icterus  NECK:  Supple, trachea is midline  CHEST:  Good air entry, no wheezes, no crackles  CVS:  RRR, S1 S2 present, no murmurs  ABD: +BS, soft, nontender, nondistended, no hepatosplenomegaly  NEURO: no focal weakness, CN 2-12 grossly intact, no gross motor deficit noted  MUSK: ROM full  Extremities: no  edema, redness, or calf tenderness  SKIN:  Dry, warm, + R axillary noted with dressing in place which looks c/d/I, + tenderness  PSYCH: mood normal    Labs reviewed:  Labs reviewed showed no leukocytosis.  WBC normal 9.1.  Hemoglobin stable 9.7  Dropped from 12.2 about a month ago.  BMP grossly unremarkable except for potassium 3.5.     Imaging reviewed:   None reported    Data:     Labs--Reviewed:  I/O (24Hr):    Intake/Output Summary (Last 24 hours) at 04/22/17 1345  Last data filed at 04/22/17 0708   Gross per 24 hour   Intake          1946.25 ml   Output             2300 ml   Net          -353.75 ml       Hematology:  Recent Labs      04/22/17   0609   WBC  9.1   RBC  3.19*   HGB  9.7*   HCT  29.0*   MCV  90.7   MCH  30.3   MCHC  33.4   RDW  12.8   PLT  228   MPV  9.4     Chemistry:  Recent Labs      04/22/17   0609   NA  139   K  3.5*   CL  100   CO2  26   GLUCOSE  161*   BUN  4*   CREATININE  0.45*   MG  2.3   ANIONGAP  13   LABGLOM  >60   GFRAA  >60   CALCIUM  8.8     No results for input(s): PROT, LABALBU, LABA1C, T3TOTAL, T4TOTAL, FT4, TSH, AST, ALT, LDH, GGT, ALKPHOS, LABGGT, BILITOT, BILIDIR, AMMONIA, AMYLASE, LIPASE, LACTATE, CHOL, HDL, LDLCHOLESTEROL, CHOLHDLRATIO, TRIG, VLDL, HIV12AB, PHENYTOIN, PHENYF, URICACID, POCGLU in the last 72 hours.    No results for input(s): POCGLU in the last 72 hours.    Lab Results   Component Value Date/Time    SPECIAL NOT REPORTED 04/21/2017 08:15 AM    SPECIAL NOT REPORTED 04/21/2017 08:15 AM     Lab Results   Component Value Date/Time    CULTURE Pending 04/21/2017 08:15 AM    CULTURE Pending 04/21/2017 08:15 AM       Imaging--Reviewed:  No results found.      Assesment:     Primary Problem  Abscess of axilla, right    Active Hospital Problems    Diagnosis Date Noted   ??? Hypokalemia [E87.6] 04/22/2017   ??? Normocytic anemia [D64.9] 04/22/2017   ??? Major depressive disorder [F32.9] 04/22/2017   ??? Abscess of axilla, right [L02.411] 04/21/2017       Past Medical History:    Diagnosis Date   ??? Bipolar 1 disorder (HCC)    ??? Constipation    ??? Depression    ??? Dizziness    ??? GERD (gastroesophageal reflux disease)    ??? Kidney stone    ???  Migraines    ??? Morbid obesity with BMI of 45.0-49.9, adult (HCC)    ??? Nausea     due to migraines and pain related   ??? Numbness and tingling     hands   ??? UTI (urinary tract infection)    ??? Wears glasses        Plan:     1.  Continue antibiotic and Wound Care per General surgery  2.  follow-up cultures  3.  pain control p.r.n.  4.  monitor H&H  5.  general surgery is primary  6. Replace and recheck electrolytes per sliding scale  7. Continue other current/supportive treatment  8. GI/DVT prophylaxis  9. Medications reviewed, resume medications per order  10. okay to discharge home on oral antibiotic from medicine standpoint  11. Case discussed with patient and the nurse    Thank you for allowing my team to participate in this patient's care, will continue to follow patient.    Electronically signed by Jeananne Rama, MD     Copy sent to Dr. Jola Babinski, MD

## 2017-04-22 NOTE — Care Coordination-Inpatient (Addendum)
Continuity of Care Form    Patient Name: Linda Gilbert   DOB:  January 20, 1986  MRN:  [id number]    Admit date:  04/21/2017  Discharge date:  04/22/17    Code Status Order: Full Code   Advance Directives: No    Admitting Physician:  Gerhard MunchWael M Otaibi, DO  PCP: Jola BabinskiKaleem U Gill, MD    Discharging Nurse:   Discharging Hospital Unit/Room#: 2051/2051-01  Discharging Unit Phone Number: [phone]    Emergency Contact:        Past Surgical History:  Past Surgical History:   Procedure Laterality Date   . APPENDECTOMY     . CARPAL TUNNEL RELEASE  12/08/13    right hand   . CHOLECYSTECTOMY      stent also in bile duct   . KNEE SURGERY Bilateral     4 surgeries on right knee, 5 on left   . OTHER SURGICAL HISTORY      stent in common bile   . OVARIAN CYST REMOVAL Bilateral    . PR DRAIN SKIN ABSCESS COMPLIC Right 04/21/2017    AXILLARY INCISION AND DRAINAGE [facility]Wael M [facility]ibi[facility]   . TONSILLECTOMY     . TUBAL LIGATION Bilateral    . TUMOR REMOVAL Left     mid back   . UPPER GASTROINTESTINAL ENDOSCOPY         Immunization History:     There is no immunization history on file for this patient.    Active Problems:  Patient Active Problem List   Diagnosis Code   . Chronic pain G89.29   . Carpal tunnel syndrome G56.00   . Pain in joint, hand M25.549   . 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 Abscess of axilla, right L02.411       Isolation/Infection:   Isolation          No Isolation            Nurse Assessment:  Last Vital Signs: BP 111/71   Pulse 80   Temp 98.6 F (37 C) (Oral)   Resp 14   Ht 5\' 3"  (1.6 m)   Wt 199 lb (90.3 kg)   LMP 04/12/2017   SpO2 98%   BMI 35.25 kg/m     Last documented pain score (0-10 scale): Pain Level: 9  Last Weight:   Wt Readings from Last 1 Encounters:   04/21/17 199 lb (90.3 kg)     Mental Status:  oriented and alert     IV Access:  - None    Nursing Mobility/ADLs:  Walking   Independent  Transfer  Independent  Bathing  Independent  Dressing   Independent  Toileting  Independent  Feeding  Independent  Med Admin  Independent  Med Delivery   whole    Wound Care Documentation and Therapy:  Incision 04/21/17 Axilla Right (Active)   Wound Assessment Intact 04/21/2017 10:00 AM   Closure UTA 04/21/2017 10:00 AM   Dressing/Treatment Dry dressing 04/21/2017 10:00 AM   Dressing Status Intact;New drainage 04/21/2017 10:00 AM   Number of days: 0       Incision 04/21/17 Axilla Right (Active)   Drainage Amount Moderate 04/21/2017 10:10 AM   Drainage Description Other (Comment) 04/21/2017 10:10 AM   Dressing Status Intact 04/21/2017 10:10 AM   Number of days: 0        Elimination:  Continence:    Bowel: Yes   Bladder:  Yes  Urinary Catheter: None   Colostomy/Ileostomy/Ileal Conduit: No           Intake/Output Summary (Last 24 hours) at 04/21/17 1148  Last data filed at 04/21/17 1006   Gross per 24 hour   Intake              700 ml   Output               10 ml   Net              690 ml     No intake/output data recorded.    Safety Concerns:     None    Impairments/Disabilities:      None    Nutrition Therapy:  Current Nutrition Therapy:   - Oral Diet:  General    Routes of Feeding: Oral  Liquids: No Restrictions  Daily Fluid Restriction: no  Last Modified Barium Swallow with Video (Video Swallowing Test): not done    Treatments at the Time of Hospital Discharge:   Respiratory Treatments: no see MAR  Oxygen Therapy:  is not on home oxygen therapy.  Ventilator:    - No ventilator support    Rehab Therapies: Physical Therapy and Occupational Therapy  Weight Bearing Status/Restrictions: No weight bearing restirctions  Other Medical Equipment (for information only, NOT a DME order):  none  Other Treatments: skilled nursing assessments, right axilla I&D on 04/21/17, dressing changes: right axilla TWICE DAILY dressing change with 1 inch iodorm, fluffs, and ABD.    Patient's personal belongings (please select all that are sent with patient):  Glasses    RN SIGNATURE:  Electronically  signed by France RavensAlyssa M Frank, RN on 04/21/17 at 12:33 PM    CASE MANAGEMENT/SOCIAL WORK SECTION    Inpatient Status Date: obs    Readmission Risk Assessment Score:  Readmission Risk              Risk of Unplanned Readmission:        7             Discharging to Facility/ Agency   [facility]  Phone [phone]  Fax [phone]      Case Manager/Social Worker signature: Electronically signed by Kerry HoughAmanda L Addysin Porco, RN on 04/22/17 at 10:53 AM    PHYSICIAN SECTION    Prognosis: Good    Condition at Discharge: Stable    Rehab Potential (if transferring to Rehab): Good    Recommended Labs or Other Treatments After Discharge: none     Physician Certification: I certify the above information and transfer of Linda Gilbert  is necessary for the continuing treatment of the diagnosis listed and that she requires Home Care for greater 30 days.     Update Admission H&P: No change in H&P    PHYSICIAN SIGNATURE:  Electronically signed by Gerhard MunchWael M Otaibi, DO on 04/22/17 at 10:15 AM  Current Discharge Medication List     START taking these medications     Medication Dose   doxycycline hyclate (VIBRA-TABS) 100 MG tablet 100 mg   Take 1 tablet by mouth 2 times daily for 10 days   Quantity: 20 tablet Refills: 0       Lactobacillus (PROBIOTIC ACIDOPHILUS) TABS 1 tablet   Take 1 tablet by mouth daily   Quantity: 10 tablet Refills: 0           CONTINUE these medications which have NOT CHANGED     Medication Dose  oxyCODONE-acetaminophen (PERCOCET) 10-325 MG per tablet 1 tablet   Take 1 tablet by mouth.Marland Kitchen       amphetamine-dextroamphetamine (ADDERALL XR) 20 MG extended release capsule 20 mg   Take 20 mg by mouth..       Cholecalciferol (VITAMIN D3) 50000 units CAPS 50,000 Units   Take 50,000 Units by mouth       tiZANidine (ZANAFLEX) 4 MG tablet 8 mg   Take 8 mg by mouth       !! ibuprofen (ADVIL;MOTRIN) 800 MG tablet 800 mg   Take 1 tablet by mouth every 8 hours as needed for Pain   Quantity: 30 tablet Refills: 0        VORTIoxetine HBr (TRINTELLIX) 20 MG TABS tablet 10 mg   Take 10 mg by mouth daily       !! ibuprofen (ADVIL;MOTRIN) 200 MG tablet 400 mg   Take 400 mg by mouth every 6 hours as needed for Pain       !! buPROPion (WELLBUTRIN XL) 150 MG XL tablet 150 mg   Take 150 mg by mouth every morning Indications: (Pt takes a  + a  tab =  XL in am) (Pt takes a  + a  tab =  XL in am)       !! buPROPion (WELLBUTRIN XL) 300 MG XL tablet 300 mg   Take 300 mg by mouth every morning Indications: (Pt takes a  + a  tab =  XL in am) (Pt takes a  + a  tab =  XL in am)       promethazine (PHENERGAN) 25 MG tablet 25 mg   Take 25 mg by mouth every 6 hours as needed for Nausea       pantoprazole (PROTONIX) 20 MG tablet 40 mg   Take 2 tablets by mouth daily   Quantity: 30 tablet Refills: 0       clonazePAM (KLONOPIN) 0.5 MG tablet 0.5 mg   Take 0.5 mg by mouth 2 times daily as needed for Anxiety.       !! Potential duplicate medications found. Please discuss with provider.   STOP taking these previous medications     Medication Dose Reason for Stopping Comments   (STOP TAKING) cephALEXin (KEFLEX) 500 MG capsule 500 mg           (STOP TAKING) sulfamethoxazole-trimethoprim (BACTRIM DS;SEPTRA DS) 800-160 MG per tablet

## 2017-04-22 NOTE — Care Coordination-Inpatient (Signed)
[facility] Encounter Date/Time: 04/21/2017 [id number]   Hospital Account: [id number]    MRN: [id number]    Patient:  Linda Gilbert   Contact Serial #: [id number]           ENCOUNTER          Patient Class: Observation Private Enc?  No Unit RM BD: [facility] MED SUR 2051/2051-01   Hospital Service: Med/Surg  ADM DX: Abscess of axilla, right*   ADM Provider: Gerhard MunchWael Gilbert Otaibi, DO  Procedure: PR DRAIN SKIN ABSCESS CO*   ATT Provider: Gerhard MunchWael Gilbert Otaibi, DO  REF Provider:       PATIENT                 Name: Linda Gilbert DOB: 1985-11-14 (30 yrs)   Address: [address] Sex: Female   [place]: [place] [id number]     Marital Status: Single   Employer: NOT EMPLOYED     Religion: Non-Denominational   Primary Care Provider: Jola BabinskiKaleem U Gill, MD     Primary Phone: [phone]   EMERGENCY CONTACT   Contact Name Legal Guardian? Relationship to Patient Home Phone Work Phone   1. Flynn,Landrie  2. 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 Rel. to Sub: Self   SECONDARY INSURANCE   Payor:  Plan:    Payor Address:  ,        Group Number:  Insurance Type:    Subscriber Name:  Subscriber DOB:    Subscriber ID:  Pat. Rel. to Sub:

## 2017-04-22 NOTE — Plan of Care (Signed)
Problem: Pain:  Goal: Control of acute pain  Control of acute pain   Outcome: Ongoing  Pt medicated with pain medication prn.  Assessed all pain characteristics including level, type, location, frequency, and onset.  Non-pharmacologic interventions offered to pt as well.  Pt states pain is tolerable at this time. Will continue to monitor.      Problem: Falls - Risk of:  Goal: Will remain free from falls  Will remain free from falls   Outcome: Met This Shift  No falls noted this shift. Patient ambulates per self without difficulty.  Bed kept in low position. Safe environment maintained. Bedside table & call light in reach. Uses call light appropriately when needing assistance.         Problem: Mobility - Impaired:  Goal: Mobility will improve  Mobility will improve   Outcome: Met This Shift  Patient able to ambulate by herself.

## 2017-04-22 NOTE — Care Coordination-Inpatient (Signed)
Reviewed new antibiotic prescription for patient at discharge. Information added to discharge instructions    - reviewed possible and common side effects. Especially monitoring for diarrhea due to doxycycline   antibiotic use.    -reviewed directions for when to take antibiotic, and dietary restrictions.    - emphasized importance of completing antibiotic therapy.    - reviewed when to call physician.    Waiting on ride for transportation home.

## 2017-04-26 LAB — CULTURE, TISSUE
Direct Exam: NONE SEEN
Direct Exam: NONE SEEN

## 2017-04-26 LAB — CULTURE, ANAEROBIC AND AEROBIC
